# Patient Record
Sex: Female | Born: 1985 | Race: Black or African American | Hispanic: No | Marital: Single | Smoking: Never smoker
Health system: Southern US, Community
[De-identification: ages and names within clinical notes are randomized; demographics above are authoritative.]

## PROBLEM LIST (undated history)

## (undated) DIAGNOSIS — D179 Benign lipomatous neoplasm, unspecified: Secondary | ICD-10-CM

## (undated) DIAGNOSIS — O24419 Gestational diabetes mellitus in pregnancy, unspecified control: Secondary | ICD-10-CM

## (undated) HISTORY — DX: Benign lipomatous neoplasm, unspecified: D17.9

## (undated) HISTORY — PX: NO PAST SURGERIES: SHX2092

---

## 2017-09-27 ENCOUNTER — Other Ambulatory Visit: Payer: Self-pay | Admitting: Internal Medicine

## 2017-09-27 DIAGNOSIS — N63 Unspecified lump in unspecified breast: Secondary | ICD-10-CM

## 2017-10-05 ENCOUNTER — Other Ambulatory Visit: Payer: Self-pay

## 2017-10-16 NOTE — L&D Delivery Note (Signed)
Patient is 32 y.o. G1P0 [redacted]w[redacted]d admitted for IOL for gHTN   Delivery Note At 10:52 AM a viable female was delivered via Vaginal, Spontaneous (Presentation: cephalic; LOA ).  APGAR: 4, 9; weight  .   Placenta status: spontaneous, intact.  Cord: 3VC   Anesthesia:  epidural Episiotomy: None Lacerations: 2nd degree;Perineal Suture Repair: 3.0 vicryl rapide Est. Blood Loss (mL):  400 mL  Mom to postpartum.  Baby to Couplet care / Skin to Skin.  Upon arrival patient was complete and pushing. She pushed with good maternal effort to deliver a healthy baby boy. Baby delivered without difficulty, was noted to have good tone and place on maternal abdomen for oral suctioning, drying and stimulation. Delayed cord clamping performed. Placenta delivered intact with 3V cord. Vaginal canal and perineum was inspected and found to have a left sulcus laceration and a second degree perineal laceration.  Lacerations were repaired with 3-0 vicryl rapide and hemostatic. Pitocin was started and uterus massaged until bleeding slowed. Counts of sharps, instruments, and lap pads were all correct.   Matilde Haymaker, MD PGY-1 9/13/201911:57 AM

## 2017-10-25 ENCOUNTER — Ambulatory Visit
Admission: RE | Admit: 2017-10-25 | Discharge: 2017-10-25 | Disposition: A | Discharge status: Home / Self Care | Payer: 59 | Source: Ambulatory Visit | Attending: Internal Medicine | Admitting: Internal Medicine

## 2017-10-25 ENCOUNTER — Other Ambulatory Visit: Payer: Self-pay | Admitting: Internal Medicine

## 2017-10-25 DIAGNOSIS — N63 Unspecified lump in unspecified breast: Secondary | ICD-10-CM

## 2017-12-21 ENCOUNTER — Ambulatory Visit (INDEPENDENT_AMBULATORY_CARE_PROVIDER_SITE_OTHER): Payer: 59 | Admitting: Family

## 2017-12-21 ENCOUNTER — Encounter: Payer: Self-pay | Admitting: Family

## 2017-12-21 VITALS — BP 133/83 | HR 115 | Temp 98.6°F | Ht 66.0 in | Wt 198.0 lb

## 2017-12-21 DIAGNOSIS — Z3480 Encounter for supervision of other normal pregnancy, unspecified trimester: Secondary | ICD-10-CM

## 2017-12-21 DIAGNOSIS — Z3200 Encounter for pregnancy test, result unknown: Secondary | ICD-10-CM

## 2017-12-21 DIAGNOSIS — Z124 Encounter for screening for malignant neoplasm of cervix: Secondary | ICD-10-CM | POA: Diagnosis not present

## 2017-12-21 DIAGNOSIS — Z1151 Encounter for screening for human papillomavirus (HPV): Secondary | ICD-10-CM

## 2017-12-21 DIAGNOSIS — O0990 Supervision of high risk pregnancy, unspecified, unspecified trimester: Secondary | ICD-10-CM | POA: Insufficient documentation

## 2017-12-21 DIAGNOSIS — Z3201 Encounter for pregnancy test, result positive: Secondary | ICD-10-CM

## 2017-12-21 DIAGNOSIS — D1721 Benign lipomatous neoplasm of skin and subcutaneous tissue of right arm: Secondary | ICD-10-CM

## 2017-12-21 DIAGNOSIS — Z113 Encounter for screening for infections with a predominantly sexual mode of transmission: Secondary | ICD-10-CM | POA: Diagnosis not present

## 2017-12-21 DIAGNOSIS — D573 Sickle-cell trait: Secondary | ICD-10-CM

## 2017-12-21 DIAGNOSIS — O99011 Anemia complicating pregnancy, first trimester: Secondary | ICD-10-CM

## 2017-12-21 DIAGNOSIS — Z3400 Encounter for supervision of normal first pregnancy, unspecified trimester: Secondary | ICD-10-CM

## 2017-12-21 LAB — POCT URINE PREGNANCY: Preg Test, Ur: POSITIVE — AB

## 2017-12-21 NOTE — Progress Notes (Signed)
Subjective:    Alyssa Davidson is a G1P0 at 12.2 days IUP by LMP confirmed by 7 wk [place] at [facility] (need records) being seen today for her first obstetrical visit.  Alyssa Davidson is her alone.  Moved to [place] in 2015 for school.  Currently works as a Scientist, forensic.  FOB does not live local, but is supportive of pregnancy.   Lives with a roommate at this time.  Her obstetrical history is significant for early prenatal care. Patient does intend to breast feed. Pregnancy history fully reviewed.  Patient reports fatigue, nausea, no bleeding and no cramping.  Unable to exercise like before due to fatigue.  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 Tested for thyroid condition, results were normal.  Plan is to excise after pregnancy completed.    Vitals:   12/21/17 1425 12/21/17 1428  BP: 133/83   Pulse: (!) 115   Temp: 98.6 F (37 C)   Weight: 198 lb (89.8 kg)   Height:  5\' 6"  (1.676 m)    HISTORY: OB History  Gravida Para Term Preterm AB Living  1            SAB TAB Ectopic Multiple Live Births               # Outcome Date GA Lbr Len/2nd Weight Sex Delivery Anes PTL Lv  1 Current              Past Medical History:  Diagnosis Date  . Lipoma    right axilla   History reviewed. No pertinent surgical history. Family History  Problem Relation Age of Onset  . Healthy Mother   . Hypertension Father   . Diabetes Father   . Healthy Sister   . Healthy Brother      Exam    BP 133/83   Pulse (!) 115   Temp 98.6 F (37 C)   Ht 5\' 6"  (1.676 m)   Wt 198 lb (89.8 kg)   LMP 09/29/2017 (Approximate)   BMI 31.96 kg/m  Uterine Size: Slightly difficult to discern due to abdominal adipose  Pelvic Exam:    Perineum: No Hemorrhoids, Normal Perineum   Vulva: normal   Vagina:  normal mucosa, normal discharge, no palpable nodules   pH: Not done   Cervix: no bleeding following Pap, no cervical motion tenderness and no lesions   Adnexa:  normal adnexa and no mass, fullness, tenderness   Bony Pelvis: Adequate  System: Breast:  No nipple retraction or dimpling, No nipple discharge or bleeding, unable to palpate mass in right axilla   Skin: normal coloration and turgor, no rashes    Neurologic: negative   Extremities: normal strength, tone, and muscle mass   HEENT neck supple with midline trachea and thyroid without masses   Mouth/Teeth mucous membranes moist, pharynx normal without lesions   Neck supple and no masses   Cardiovascular: regular rate and rhythm, no murmurs or gallops   Respiratory:  appears well, vitals normal, no respiratory distress, acyanotic, normal RR, neck free of mass or lymphadenopathy, chest clear, no wheezing, crepitations, rhonchi, normal symmetric air entry   Abdomen: soft, non-tender; bowel sounds normal; no masses,  no organomegaly   Urinary: urethral meatus normal        Assessment:    Pregnancy: G1P0 Patient Active Problem List   Diagnosis Date Noted  . Supervision of other normal pregnancy, antepartum 12/21/2017        Plan:  Initial labs drawn.  Pap smear collected.   Prenatal vitamins. Problem list reviewed and updated. Genetic Screening discussed:  Panorama requested.  Ultrasound discussed; fetal survey: will be ordered at 18-19 weeks.  Follow up in 4 weeks. 75% of 25 min visit spent on counseling and coordination of care.     Venia Carbon Sh[facility]on [employer] 12/22/2017

## 2017-12-21 NOTE — Patient Instructions (Addendum)
Third Trimester of Pregnancy The third trimester is from week 28 through week 40 (months 7 through 9). The third trimester is a time when the unborn baby (fetus) is growing rapidly. At the end of the ninth month, the fetus is about 20 inches in length and weighs 6-10 pounds. Body changes during your third trimester Your body will continue to go through many changes during pregnancy. The changes vary from woman to woman. During the third trimester:  Your weight will continue to increase. You can expect to gain 25-35 pounds (11-16 kg) by the end of the pregnancy.  You may begin to get stretch marks on your hips, abdomen, and breasts.  You may urinate more often because the fetus is moving lower into your pelvis and pressing on your bladder.  You may develop or continue to have heartburn. This is caused by increased hormones that slow down muscles in the digestive tract.  You may develop or continue to have constipation because increased hormones slow digestion and cause the muscles that push waste through your intestines to relax.  You may develop hemorrhoids. These are swollen veins (varicose veins) in the rectum that can itch or be painful.  You may develop swollen, bulging veins (varicose veins) in your legs.  You may have increased body aches in the pelvis, back, or thighs. This is due to weight gain and increased hormones that are relaxing your joints.  You may have changes in your hair. These can include thickening of your hair, rapid growth, and changes in texture. Some women also have hair loss during or after pregnancy, or hair that feels dry or thin. Your hair will most likely return to normal after your baby is born.  Your breasts will continue to grow and they will continue to become tender. A yellow fluid (colostrum) may leak from your breasts. This is the first milk you are producing for your baby.  Your belly button may stick out.  You may notice more swelling in your hands,  face, or ankles.  You may have increased tingling or numbness in your hands, arms, and legs. The skin on your belly may also feel numb.  You may feel short of breath because of your expanding uterus.  You may have more problems sleeping. This can be caused by the size of your belly, increased need to urinate, and an increase in your body's metabolism.  You may notice the fetus "dropping," or moving lower in your abdomen (lightening).  You may have increased vaginal discharge.  You may notice your joints feel loose and you may have pain around your pelvic bone.  What to expect at prenatal visits You will have prenatal exams every 2 weeks until week 36. Then you will have weekly prenatal exams. During a routine prenatal visit:  You will be weighed to make sure you and the baby are growing normally.  Your blood pressure will be taken.  Your abdomen will be measured to track your baby's growth.  The fetal heartbeat will be listened to.  Any test results from the previous visit will be discussed.  You may have a cervical check near your due date to see if your cervix has softened or thinned (effaced).  You will be tested for Group B streptococcus. This happens between 35 and 37 weeks.  Your health care provider may ask you:  What your birth plan is.  How you are feeling.  If you are feeling the baby move.  If you have had   any abnormal symptoms, such as leaking fluid, bleeding, severe headaches, or abdominal cramping.  If you are using any tobacco products, including cigarettes, chewing tobacco, and electronic cigarettes.  If you have any questions.  Other tests or screenings that may be performed during your third trimester include:  Blood tests that check for low iron levels (anemia).  Fetal testing to check the health, activity level, and growth of the fetus. Testing is done if you have certain medical conditions or if there are problems during the  pregnancy.  Nonstress test (NST). This test checks the health of your baby to make sure there are no signs of problems, such as the baby not getting enough oxygen. During this test, a belt is placed around your belly. The baby is made to move, and its heart rate is monitored during movement.  What is false labor? False labor is a condition in which you feel small, irregular tightenings of the muscles in the womb (contractions) that usually go away with rest, changing position, or drinking water. These are called Braxton Hicks contractions. Contractions may last for hours, days, or even weeks before true labor sets in. If contractions come at regular intervals, become more frequent, increase in intensity, or become painful, you should see your health care provider. What are the signs of labor?  Abdominal cramps.  Regular contractions that start at 10 minutes apart and become stronger and more frequent with time.  Contractions that start on the top of the uterus and spread down to the lower abdomen and back.  Increased pelvic pressure and dull back pain.  A watery or bloody mucus discharge that comes from the vagina.  Leaking of amniotic fluid. This is also known as your "water breaking." It could be a slow trickle or a gush. Let your health care provider know if it has a color or strange odor. If you have any of these signs, call your health care provider right away, even if it is before your due date. Follow these instructions at home: Medicines  Follow your health care provider's instructions regarding medicine use. Specific medicines may be either safe or unsafe to take during pregnancy.  Take a prenatal vitamin that contains at least 600 micrograms (mcg) of folic acid.  If you develop constipation, try taking a stool softener if your health care provider approves. Eating and drinking  Eat a balanced diet that includes fresh fruits and vegetables, whole grains, good sources of protein  such as meat, eggs, or tofu, and low-fat dairy. Your health care provider will help you determine the amount of weight gain that is right for you.  Avoid raw meat and uncooked cheese. These carry germs that can cause birth defects in the baby.  If you have low calcium intake from food, talk to your health care provider about whether you should take a daily calcium supplement.  Eat four or five small meals rather than three large meals a day.  Limit foods that are high in fat and processed sugars, such as fried and sweet foods.  To prevent constipation: ? Drink enough fluid to keep your urine clear or pale yellow. ? Eat foods that are high in fiber, such as fresh fruits and vegetables, whole grains, and beans. Activity  Exercise only as directed by your health care provider. Most women can continue their usual exercise routine during pregnancy. Try to exercise for 30 minutes at least 5 days a week. Stop exercising if you experience uterine contractions.  Avoid heavy   lifting.  Do not exercise in extreme heat or humidity, or at high altitudes.  Wear low-heel, comfortable shoes.  Practice good posture.  You may continue to have sex unless your health care provider tells you otherwise. Relieving pain and discomfort  Take frequent breaks and rest with your legs elevated if you have leg cramps or low back pain.  Take warm sitz baths to soothe any pain or discomfort caused by hemorrhoids. Use hemorrhoid cream if your health care provider approves.  Wear a good support bra to prevent discomfort from breast tenderness.  If you develop varicose veins: ? Wear support pantyhose or compression stockings as told by your healthcare provider. ? Elevate your feet for 15 minutes, 3-4 times a day. Prenatal care  Write down your questions. Take them to your prenatal visits.  Keep all your prenatal visits as told by your health care provider. This is important. Safety  Wear your seat belt at  all times when driving.  Make a list of emergency phone numbers, including numbers for family, friends, the hospital, and police and fire departments. General instructions  Avoid cat litter boxes and soil used by cats. These carry germs that can cause birth defects in the baby. If you have a cat, ask someone to clean the litter box for you.  Do not travel far distances unless it is absolutely necessary and only with the approval of your health care provider.  Do not use hot tubs, steam rooms, or saunas.  Do not drink alcohol.  Do not use any products that contain nicotine or tobacco, such as cigarettes and e-cigarettes. If you need help quitting, ask your health care provider.  Do not use any medicinal herbs or unprescribed drugs. These chemicals affect the formation and growth of the baby.  Do not douche or use tampons or scented sanitary pads.  Do not cross your legs for long periods of time.  To prepare for the arrival of your baby: ? Take prenatal classes to understand, practice, and ask questions about labor and delivery. ? Make a trial run to the hospital. ? Visit the hospital and tour the maternity area. ? Arrange for maternity or paternity leave through employers. ? Arrange for family and friends to take care of pets while you are in the hospital. ? Purchase a rear-facing car seat and make sure you know how to install it in your car. ? Pack your hospital bag. ? Prepare the baby's nursery. Make sure to remove all pillows and stuffed animals from the baby's crib to prevent suffocation.  Visit your dentist if you have not gone during your pregnancy. Use a soft toothbrush to brush your teeth and be gentle when you floss. Contact a health care provider if:  You are unsure if you are in labor or if your water has broken.  You become dizzy.  You have mild pelvic cramps, pelvic pressure, or nagging pain in your abdominal area.  You have lower back pain.  You have persistent  nausea, vomiting, or diarrhea.  You have an unusual or bad smelling vaginal discharge.  You have pain when you urinate. Get help right away if:  Your water breaks before 37 weeks.  You have regular contractions less than 5 minutes apart before 37 weeks.  You have a fever.  You are leaking fluid from your vagina.  You have spotting or bleeding from your vagina.  You have severe abdominal pain or cramping.  You have rapid weight loss or weight gain.    You have shortness of breath with chest pain.  You notice sudden or extreme swelling of your face, hands, ankles, feet, or legs.  Your baby makes fewer than 10 movements in 2 hours.  You have severe headaches that do not go away when you take medicine.  You have vision changes. Summary  The third trimester is from week 28 through week 40, months 7 through 9. The third trimester is a time when the unborn baby (fetus) is growing rapidly.  During the third trimester, your discomfort may increase as you and your baby continue to gain weight. You may have abdominal, leg, and back pain, sleeping problems, and an increased need to urinate.  During the third trimester your breasts will keep growing and they will continue to become tender. A yellow fluid (colostrum) may leak from your breasts. This is the first milk you are producing for your baby.  False labor is a condition in which you feel small, irregular tightenings of the muscles in the womb (contractions) that eventually go away. These are called Braxton Hicks contractions. Contractions may last for hours, days, or even weeks before true labor sets in.  Signs of labor can include: abdominal cramps; regular contractions that start at 10 minutes apart and become stronger and more frequent with time; watery or bloody mucus discharge that comes from the vagina; increased pelvic pressure and dull back pain; and leaking of amniotic fluid. This information is not intended to replace advice  given to you by your health care provider. Make sure you discuss any questions you have with your health care provider. Document Released: 09/26/2001 Document Revised: 03/09/2016 Document Reviewed: 12/03/2012 Elsevier Interactive Patient Education  2017 [employer]  

## 2017-12-22 DIAGNOSIS — D179 Benign lipomatous neoplasm, unspecified: Secondary | ICD-10-CM | POA: Insufficient documentation

## 2017-12-25 LAB — CULTURE, OB URINE

## 2017-12-25 LAB — URINE CULTURE, OB REFLEX

## 2017-12-26 LAB — PRENATAL PROFILE I(LABCORP)
ANTIBODY SCREEN: NEGATIVE
BASOS: 0 %
Basophils Absolute: 0 10*3/uL (ref 0.0–0.2)
EOS (ABSOLUTE): 0.2 10*3/uL (ref 0.0–0.4)
Eos: 2 %
HEMOGLOBIN: 13.7 g/dL (ref 11.1–15.9)
Hematocrit: 40.3 % (ref 34.0–46.6)
Hepatitis B Surface Ag: NEGATIVE
Immature Grans (Abs): 0 10*3/uL (ref 0.0–0.1)
Immature Granulocytes: 1 %
LYMPHS ABS: 2 10*3/uL (ref 0.7–3.1)
Lymphs: 24 %
MCH: 28.5 pg (ref 26.6–33.0)
MCHC: 34 g/dL (ref 31.5–35.7)
MCV: 84 fL (ref 79–97)
MONOS ABS: 1.1 10*3/uL — AB (ref 0.1–0.9)
Monocytes: 13 %
NEUTROS ABS: 5 10*3/uL (ref 1.4–7.0)
Neutrophils: 60 %
Platelets: 242 10*3/uL (ref 150–379)
RBC: 4.81 x10E6/uL (ref 3.77–5.28)
RDW: 14.4 % (ref 12.3–15.4)
RPR Ser Ql: NONREACTIVE
RUBELLA: 15.5 {index} (ref >0.99)
Rh Factor: POSITIVE
WBC: 8.3 10*3/uL (ref 3.4–10.8)

## 2017-12-26 LAB — HEMOGLOBINOPATHY EVALUATION
HEMOGLOBIN A2 QUANTITATION: 4 % — AB (ref 1.8–3.2)
HGB C: 0 %
HGB S: 39.1 % — ABNORMAL HIGH
HGB VARIANT: 0 %
Hemoglobin F Quantitation: 0 % (ref 0.0–2.0)
Hgb A: 56.9 % — ABNORMAL LOW (ref 96.4–98.8)

## 2017-12-27 DIAGNOSIS — D573 Sickle-cell trait: Secondary | ICD-10-CM | POA: Insufficient documentation

## 2017-12-31 LAB — CYTOLOGY - PAP
CHLAMYDIA, DNA PROBE: NEGATIVE
DIAGNOSIS: REACTIVE
Diagnosis: NEGATIVE
HPV: NOT DETECTED
Neisseria Gonorrhea: NEGATIVE

## 2018-01-18 ENCOUNTER — Ambulatory Visit (INDEPENDENT_AMBULATORY_CARE_PROVIDER_SITE_OTHER): Payer: 59 | Admitting: Family

## 2018-01-18 VITALS — BP 125/83 | HR 95 | Temp 98.6°F | Wt 205.8 lb

## 2018-01-18 DIAGNOSIS — Z3400 Encounter for supervision of normal first pregnancy, unspecified trimester: Secondary | ICD-10-CM

## 2018-01-18 NOTE — Progress Notes (Signed)
   PRENATAL VISIT NOTE  Subjective:  Alyssa Davidson is a 32 y.o. G1P0 at [redacted]w[redacted]d being seen today for ongoing prenatal care.  She is currently monitored for the following issues for this low-risk pregnancy and has Supervision of other normal pregnancy, antepartum; Lipoma; and Sickle cell trait ([place]) on their problem list.  Patient reports no complaints.   . Vag. Bleeding: None.   . Denies leaking of fluid.   The following portions of the patient's history were reviewed and updated as appropriate: allergies, current medications, past family history, past medical history, past social history, past surgical history and problem list. Problem list updated.  Objective:   Vitals:   01/18/18 1356  BP: 125/83  Pulse: 95  Temp: 98.6 F (37 C)  Weight: 205 lb 12.8 oz (93.4 kg)    Fetal Status: Fetal Heart Rate (bpm): 164 Fundal Height: 17 cm       General:  Alert, oriented and cooperative. Patient is in no acute distress.  Skin: Skin is warm and dry. No rash noted.   Cardiovascular: Normal heart rate noted  Respiratory: Normal respiratory effort, no problems with respiration noted  Abdomen: Soft, gravid, appropriate for gestational age.  Pain/Pressure: Absent     Pelvic: Cervical exam deferred        Extremities: Normal range of motion.     Mental Status: Normal mood and affect. Normal behavior. Normal judgment and thought content.   Assessment and Plan:  Pregnancy: G1P0 at [redacted]w[redacted]d  1. Supervision of normal first pregnancy, antepartum - [place] MFM OB COMP + 14 WK; Future - HIV antibody - Discussed weight and minimizing carbohydrates (rice/beans)  Preterm labor symptoms and general obstetric precautions including but not limited to vaginal bleeding, contractions, leaking of fluid and fetal movement were reviewed in detail with the patient. Please refer to After Visit Summary for other counseling recommendations.  Return in about 1 month (around 02/15/2018).  No future appointments.  Kathrine Haddock, CNM

## 2018-01-19 LAB — SPECIMEN STATUS

## 2018-01-21 LAB — HIV ANTIBODY (ROUTINE TESTING W REFLEX): HIV Screen 4th Generation wRfx: NONREACTIVE

## 2018-01-31 ENCOUNTER — Ambulatory Visit (HOSPITAL_COMMUNITY): Payer: 59

## 2018-02-05 ENCOUNTER — Ambulatory Visit (HOSPITAL_COMMUNITY)
Admission: RE | Admit: 2018-02-05 | Discharge: 2018-02-05 | Disposition: A | Discharge status: Home / Self Care | Payer: 59 | Source: Ambulatory Visit | Attending: Family | Admitting: Family

## 2018-02-05 ENCOUNTER — Other Ambulatory Visit: Payer: Self-pay | Admitting: Family

## 2018-02-05 DIAGNOSIS — Z3400 Encounter for supervision of normal first pregnancy, unspecified trimester: Secondary | ICD-10-CM

## 2018-02-05 DIAGNOSIS — Z3A18 18 weeks gestation of pregnancy: Secondary | ICD-10-CM | POA: Insufficient documentation

## 2018-02-05 DIAGNOSIS — Z369 Encounter for antenatal screening, unspecified: Secondary | ICD-10-CM | POA: Diagnosis present

## 2018-02-12 ENCOUNTER — Telehealth: Payer: Self-pay | Admitting: Family Medicine

## 2018-02-12 NOTE — Telephone Encounter (Signed)
Reviewed ultrasound results as incomplete but not abnormal. F/u u/s recommended in 6 wks. Will be scheduled at next visit.

## 2018-02-15 ENCOUNTER — Ambulatory Visit (INDEPENDENT_AMBULATORY_CARE_PROVIDER_SITE_OTHER): Payer: 59 | Admitting: Family

## 2018-02-15 VITALS — BP 113/75 | HR 105 | Temp 98.7°F | Wt 213.4 lb

## 2018-02-15 DIAGNOSIS — Z363 Encounter for antenatal screening for malformations: Secondary | ICD-10-CM

## 2018-02-15 DIAGNOSIS — Z3480 Encounter for supervision of other normal pregnancy, unspecified trimester: Secondary | ICD-10-CM

## 2018-02-15 NOTE — Progress Notes (Signed)
   PRENATAL VISIT NOTE  Subjective:  Alyssa Davidson is a 32 y.o. G1P0 at [redacted]w[redacted]d being seen today for ongoing prenatal care.  She is currently monitored for the following issues for this low-risk pregnancy and has Supervision of other normal pregnancy, antepartum and Sickle cell trait ([place]) on their problem list.  Patient reports no complaints.  Pt concerned about the weight gain.  24 hr recall:  B oats, milk,  L sweet potato porridge; D veggies and chicken breast.   Contractions: Not present. Vag. Bleeding: None.  Movement: Present. Denies leaking of fluid.   The following portions of the patient's history were reviewed and updated as appropriate: allergies, current medications, past family history, past medical history, past social history, past surgical history and problem list. Problem list updated.  Objective:   Vitals:   02/15/18 1643  BP: 113/75  Pulse: (!) 105  Temp: 98.7 F (37.1 C)  Weight: 213 lb 6.4 oz (96.8 kg)    Fetal Status: Fetal Heart Rate (bpm): 162 Fundal Height: 22 cm Movement: Present     General:  Alert, oriented and cooperative. Patient is in no acute distress.  Skin: Skin is warm and dry. No rash noted.   Cardiovascular: Normal heart rate noted  Respiratory: Normal respiratory effort, no problems with respiration noted  Abdomen: Soft, gravid, appropriate for gestational age.  Pain/Pressure: Absent     Pelvic: Cervical exam deferred        Extremities: Normal range of motion.  Edema: None  Mental Status: Normal mood and affect. Normal behavior. Normal judgment and thought content.   Assessment and Plan:  Pregnancy: G1P0 at [redacted]w[redacted]d  1. Supervision of other normal pregnancy, antepartum - Reviewed anatomy ultrasound - Discussed strategy of decreasing carbs in am and increasing walking time  Preterm labor symptoms and general obstetric precautions including but not limited to vaginal bleeding, contractions, leaking of fluid and fetal movement were reviewed in  detail with the patient. Please refer to After Visit Summary for other counseling recommendations.  Return in about 1 month (around 03/15/2018).  No future appointments.  Kathrine Haddock, CNM

## 2018-02-15 NOTE — Addendum Note (Signed)
Addended by: Gwen Pounds on: 02/15/2018 05:09 PM   Modules accepted: Orders

## 2018-02-18 ENCOUNTER — Encounter (INDEPENDENT_AMBULATORY_CARE_PROVIDER_SITE_OTHER): Payer: Self-pay

## 2018-03-12 ENCOUNTER — Other Ambulatory Visit: Payer: Self-pay | Admitting: Family

## 2018-03-12 ENCOUNTER — Ambulatory Visit (HOSPITAL_COMMUNITY)
Admission: RE | Admit: 2018-03-12 | Discharge: 2018-03-12 | Disposition: A | Discharge status: Home / Self Care | Payer: 59 | Source: Ambulatory Visit | Attending: Family | Admitting: Family

## 2018-03-12 DIAGNOSIS — Z0489 Encounter for examination and observation for other specified reasons: Secondary | ICD-10-CM

## 2018-03-12 DIAGNOSIS — Z362 Encounter for other antenatal screening follow-up: Secondary | ICD-10-CM

## 2018-03-12 DIAGNOSIS — IMO0002 Reserved for concepts with insufficient information to code with codable children: Secondary | ICD-10-CM

## 2018-03-12 DIAGNOSIS — Z3A23 23 weeks gestation of pregnancy: Secondary | ICD-10-CM

## 2018-03-12 DIAGNOSIS — O321XX0 Maternal care for breech presentation, not applicable or unspecified: Secondary | ICD-10-CM | POA: Diagnosis not present

## 2018-03-12 DIAGNOSIS — Z363 Encounter for antenatal screening for malformations: Secondary | ICD-10-CM | POA: Diagnosis not present

## 2018-03-14 ENCOUNTER — Ambulatory Visit (INDEPENDENT_AMBULATORY_CARE_PROVIDER_SITE_OTHER): Payer: 59 | Admitting: Advanced Practice Midwife

## 2018-03-14 ENCOUNTER — Encounter: Payer: Self-pay | Admitting: Advanced Practice Midwife

## 2018-03-14 VITALS — BP 115/79 | HR 109 | Wt 219.2 lb

## 2018-03-14 DIAGNOSIS — Z3480 Encounter for supervision of other normal pregnancy, unspecified trimester: Secondary | ICD-10-CM

## 2018-03-14 DIAGNOSIS — D573 Sickle-cell trait: Secondary | ICD-10-CM

## 2018-03-14 MED ORDER — FAMOTIDINE 40 MG PO TABS: 40.0000 mg | ORAL_TABLET | Freq: Every day | ORAL | 6 refills | Status: DC

## 2018-03-14 MED ORDER — FAMOTIDINE 40 MG PO TABS
40.0000 mg | ORAL_TABLET | Freq: Every day | ORAL | 6 refills | Status: DC
Start: 2018-03-14 — End: 2018-03-14

## 2018-03-14 NOTE — Patient Instructions (Addendum)
Gestational diabetes mellitus (GDM) is high blood glucose (high blood sugar) that develops during pregnancy (ADA, 2018). With routine prenatal care provided in the [place] ([place] most people drink "Glucola" as part of a screening test before diagnosing gestational diabetes. In other parts of the world, care providers may give mothers a screening test and/or a diagnostic test for GDM using other types of glucose drinks. Diagnosing gestational diabetes is a complex topic with lots of controversy. Even though we have a lot of research on diagnosing gestational diabetes, professionals around the world still disagree on the best way to screen for and diagnose this condition. This article will describe gestational diabetes, explain the reasons for the disagreement over diagnosing gestational diabetes, and discuss the potential risks linked to the condition, as well as the potential benefits from treatment. What is gestational diabetes? To understand gestational diabetes, it's helpful to first learn how the body metabolizes sugar. After you eat or drink carbohydrates (often called "carbs"), your gastrointestinal system helps the carbohydrates enter your bloodstream as glucose (often called "sugar"), which your body must turn into energy. Insulin is a hormone produced by the pancreas that helps deliver glucose from the blood into your body's cells, where the glucose can be turned into energy that fuels your body's functions. Insulin also helps convert extra glucose into fat for storage. All pregnant people experience some metabolic changes during pregnancy. In normal pregnancy, hormones from the placenta make it harder for your body to use insulin-you may require up to three times as much insulin to overcome the increased insulin resistance (ADA, 2016). Insulin resistance means that your cells are resistant to insulin-it's kind of like if a neighbor (i.e. insulin) keeps knocking on your door (i.e. cell) with  gifts of food, and over time, has to knock louder and louder to get you to open the door! In a pregnancy that is not complicated by gestational diabetes, it's harder for insulin to 'open the door,' but the body's insulin response, or ability to produce more insulin, is enough to overcome the resistance. However, with gestational diabetes, there is too much insulin resistance, too little insulin response (called low beta cell function), or a combination of both (Powe et al. 2016). Some women with GDM have more of a problem with insulin resistance, while others with GDM have more of a problem with low beta cell function (Personal correspondence, Dr. Barbour, 2018). Going back to our analogy, low beta cell function is like if the neighbor who is knocking gets tired over time and knocks more softly. So, with GDM, the door doesn't open because of your high reluctance to answer it (insulin resistance), the neighbor's low intensity in knocking (low beta cell function), or a combination of both factors. You can imagine that with either scenario, the neighbor gives up and takes the food somewhere else. In a similar way, when this happens with GDM, glucose builds up in the blood until it reaches abnormally high levels, called hyperglycemia. The routine tests that are done in pregnancy to identify GDM do not directly measure insulin resistance or beta cell function. Instead, the tests measure blood sugar levels, because it is high blood sugar that can cause problems for mother and baby. If you have GDM, treatment with diet, exercise, and sometimes medicine, is necessary to maintain healthy blood sugar levels. Researchers think insulin resistance exists to help move more nutrients to the baby (instead of the mother) to promote healthy fetal growth and development (Farrar et al. 2017a). The   mother's body is making sure that the baby gets enough nutrition from sugar in the blood, even if food becomes scarce for the mother.  This adaptation helped us in the past, but most people today have too much food available-including too many processed foods with simple, easily digested sugars. This situation has led to more people putting on extra body weight, which tends to increase insulin resistance and decrease beta cell function, which further increases the risk of high blood sugar. The one-part diagnostic method  Outside of the [place] most countries promote some variation of the one-part diagnostic method (universal or selective), although in [place] they have endorsed a different version of the two-part screening and diagnostic method (Table 5). According to IADPSG criteria, the one-part diagnostic test is a 75-gram, 2-hour OGTT, which requires fasting before the test. This OGTT measures blood sugar after fasting and again at one and two hours after the test. Gestational diabetes is diagnosed with one or more high blood sugar values. Method: . Drink a 75-gram diagnostic OGTT, with blood sugar measured after fasting (?8 hours) and at 1 and 2-hours after the test.  . The diagnosis of GDM is made when any of the following blood sugar values are met or exceeded:  . Fasting: 92 mg/dL  . 1-hour: 180 mg/dL  . 2-hour: 153 mg/dL Unlike the two-part screening and diagnostic method, the one-part diagnostic method test cutoffs were developed based specifically on pregnancy and birth outcomes instead of the mother's future risk of diabetes (IADPSG, 2010; ADA, 2018). As we already mentioned, adopting the IADPSG criteria would greatly increase the rate of people diagnosed with GDM (NIH, 2013). Using the one-part diagnostic method, more people would potentially benefit from treatment for high blood sugar. However, there are also downsides (which is why there is no agreement on which method is best). Using the 75-gram test with IADPSG criteria, everyone has to fast before the test, which may be difficult for some people. Also, an increase in the  number of people diagnosed with GDM comes with an increase in personal and health care costs. Mothers diagnosed with GDM face more medical appointments (to meet with a registered dietitian, a diabetes educator, or both) and they are told to carefully watch what they eat and monitor blood sugar levels several times a day (NIH, 2013). Testing supplies, blood sugar medication (if needed), and extra monitoring all come with significant costs, which are not always fully covered by insurance in the [place] A diagnosis of GDM can be stressful for some mothers, and care providers may pressure women to schedule an induction simply because they have been diagnosed as having GDM. The bottom line . We have strong evidence that treating GDM improves birth outcomes for mothers and babies. . Gestational diabetes begins during pregnancy, but some people enter pregnancy with pre-existing diabetes (type 2 diabetes) that was previously undiagnosed. To detect pre-existing diabetes, care providers may offer screening in early pregnancy to mothers with risk factors for type 2 diabetes. . There is widespread agreement that screening or testing for GDM should take place between 24 and 28 weeks of pregnancy. However, researchers and organizations disagree about the best way to screen and diagnose GDM: . Some countries and professional organizations (such as ACOG in the [place] prefer a two-part method that includes a screening test (frequently the "Glucola" drink), and if that is positive, women take a diagnostic test (which involves fasting, drinking a glucose beverage, and having multiple blood tests). . However, most other countries   and organizations prefer a one-part method where everybody (or at least everybody with risk factors for GDM) receives the one-part diagnostic test. . With the two-part screening and diagnostic method used in the [place] cutoffs for GDM diagnosis vary by hospital. When you get your results, it may be helpful  to obtain the actual numbers, rather than a statement that you "passed" or "failed" the glucose test. Compare your test results with the Carpenter-Coustan or National Diabetes Data Group Criteria to get a better feel for where your results fall. . Although many people contact us about alternatives to drinking the standard glucose solution, the evidence on alternatives is very limited at this time: . We don't know if getting a sugar load from candy, juice, or food screens for GDM as well as the standard glucose drink. Marland Kitchen Researchers have suggested that a fasting plasma glucose test in the third trimester may be useful as a screening test when it is used with upper and lower cutoffs to 'rule-in' or 'rule-out' GDM. However, more research is needed. Marland Kitchen People who would rather not drink the standard glucose beverage, or who can't due to vomiting or other reasons, could discuss alternative methods with their provider. However, we do not have sufficient evidence on alternatives at this time to state which alternative is best, or how accurate these alternatives may be. Receiving a diagnosis of GDM can be stressful for many people. However, the benefits of a positive test result are that you can uncover the potential for health problems before they become a real problem, and take action to improve your health and birth outcomes.  AREA PEDIATRIC/FAMILY [place] [address] [place], [place]  [address] Phone - [phone]   Fax - [phone]  [facility] [address] [place], [place] [address] Phone - [phone]   Fax - [place] [address], [place]  [address] Phone - [phone]   Fax - [phone]  [place] [address] [place] [address] [place], [place]  [address] Phone - [phone]   Fax - [phone]  [place] [address] [place], [place]   [address] Phone - [phone]   Fax - [phone]  [facility] [address] [place], [place]  [address] Phone - [phone]   Fax - [place] [address], [place], [place]  [address] Phone - [phone]   Fax - [phone]  [place] [address] [place], [place] [address] [place], [place]  [address] Phone - [phone]   Fax - [place] [address] [place], [place]  [address] Phone - [phone]   Fax - [phone] [facility] [place]. [address] [place], [place]  [address] Phone - [phone]   Fax - [phone]  [place] [address] [place], [place]  [address] Phone - [phone]   Fax - [phone]  [facility] [place], [place], [place]  [address] Phone - [phone]   Fax - [place] [address], [place], [place]  [address] Phone - [phone]   Fax - [phone]  [facility] [address] [place], [place]  [address] Phone - [place] [address] [place], [place]  [address] Phone -  [phone]   Fax - [place] [address] [place], [place]  [address] Phone - [phone]   Fax - [phone]  [place] [address] [place], [place]  [address] Phone - [phone]   Fax - [phone]  [place] [place], [place]  [address] Phone - [phone]   Fax - [place], [place], [place]  [address] Phone - [phone]   Fax - [place] [address], [place], [place]  [address] Phone - [phone]   Fax - [phone]  [place] [address] [place], [place]  [address] Phone - [phone]   Fax - [place] [address] [place], [place]  [address] Phone - [phone]   Fax - [phone]  [place] [address] [place], [place]  [address] Phone - [phone]   Fax - [phone] [place] [address] [place], [place]  [address] Phone - [phone]   Fax - [place] [address] [place], [place]  [address] Phone - [phone]   Fax - [phone]  [place] [address], [place], [place]  [address] Phone - [phone]   Fax - [phone]  [place] [address] [place] [address] Phone [phone]  Fax [phone] Childbirth Education Options: [facility] Department Classes:  Childbirth education classes can help you get ready for a positive parenting experience. You can also meet other expectant parents and get free stuff for your baby. Each class runs for five weeks on the same night and costs $45 for the mother-to-be and her support person. Medicaid covers the cost if you are eligible. Call [phone] to register. [facility] Childbirth Education:  [phone] or [phone] or [email or web address]  Baby & Me Class: Discuss newborn & infant parenting and family adjustment issues with other new mothers in a relaxed environment. Each week brings a new speaker or baby-centered activity. We encourage new mothers to join [place] every Thursday at 11:00am. Babies birth until crawling. No registration or fee. Daddy [employer]: This course offers Dads-to-be the tools and knowledge needed to feel confident on their journey to becoming new fathers. Experienced dads, who have been trained as coaches, teach dads-to-be how to hold, comfort, diaper, swaddle and play with  their infant while being able to support the new mom as well. A class for men taught by men. $25/dad Big Brother/Big Sister: Let your children share in the joy of a new brother or sister in this special class designed just for them. Class includes discussion about how families care for babies: swaddling, holding, diapering, safety as well as how they can be helpful in their new role. This class is designed for children ages 41 to 37, but any age is welcome. Please register each child individually. $5/child  Mom Talk: This mom-led group offers support and connection to mothers as they journey through the adjustments and struggles of that sometimes overwhelming first year after the birth of a child. Tuesdays at 10:00am and Thursdays at 6:00pm. Babies welcome. No registration or fee. Breastfeeding Support Group: This group is a mother-to-mother support circle where moms have the opportunity to share their breastfeeding experiences. A Lactation Consultant is present for questions and concerns. Meets each Tuesday at  11:00am. No fee or registration. Breastfeeding Your Baby: Learn what to expect in the first days of breastfeeding your newborn.  This class will help you feel more confident with the skills needed to begin your breastfeeding experience. Many new mothers are concerned about breastfeeding after leaving the hospital. This class will also address the most common fears and challenges about breastfeeding during the first few weeks, months and beyond. (call for fee) Comfort Techniques and Tour: This 2 hour interactive class will provide you the opportunity to learn & practice hands-on techniques that can help relieve some of the discomfort of labor and encourage your baby to rotate toward the best position for birth. You and your partner will be able to try a variety of labor positions with birth balls and rebozos as well as practice breathing, relaxation, and visualization techniques. A tour of the [facility] is included with this class. $20 per registrant and support person Childbirth Class- Weekend Option: This class is a Weekend version of our Birth & Baby series. It is designed for parents who have a difficult time fitting several weeks of classes into their schedule. It covers the care of your newborn and the basics of labor and childbirth. It also includes a [place] [facility] and lunch. The class is held two consecutive days: beginning on Friday evening from 6:30 - 8:30 p.m. and the next day, Saturday from 9 a.m. - 4 p.m. (call for fee) Doren Custard Class: Interested in a waterbirth?  This informational class will help you discover whether waterbirth is the right fit for you. Education about waterbirth itself, supplies you would need and how to assemble your support team is what you can expect from this class. Some obstetrical practices require this class in order to pursue a waterbirth. (Not all obstetrical practices offer waterbirth-check with your healthcare provider.) Register only the expectant mom, but you are encouraged to bring your partner to class! Required if planning waterbirth, no fee. Infant/Child CPR: Parents, grandparents, babysitters, and friends learn Cardio-Pulmonary Resuscitation skills for infants and children. You will also learn how to treat both conscious and unconscious choking in infants and children. This Family & Friends program does not offer certification. Register each participant individually to ensure that enough mannequins are available. (Call for fee) Grandparent Love: Expecting a grandbaby? This class is for you! Learn about the latest infant care and safety recommendations and ways to support your own child as he or she transitions into the parenting role. Taught by Registered Nurses who are childbirth instructors, but most importantly...they are grandmothers too! $10/person. Childbirth Class- Natural Childbirth:  This series of 5 weekly classes is for expectant parents who want to learn and practice natural methods of coping with the process of labor and childbirth. Relaxation, breathing, massage, visualization, role of the partner, and helpful positioning are highlighted. Participants learn how to be confident in their body's ability to give birth. This class will empower and help parents make informed decisions about their own care. Includes discussion that will help new parents transition into the immediate postpartum period. [facility] is included. We suggest taking this class between 25-32 weeks, but it's only a recommendation. $75 per registrant and one support person or $30 Medicaid. Childbirth Class- 3 week Series: This option of 3 weekly classes helps you and your labor partner prepare for childbirth. Newborn care, labor & birth, cesarean birth, pain management, and comfort techniques are discussed and a  [facility] is included. The class meets at the same time, on the same day of the week for 3 consecutive weeks beginning with the starting date you choose. $60 for registrant and one support person.  Marvelous Multiples: Expecting twins, triplets, or more? This class covers the differences in labor, birth, parenting, and breastfeeding issues that face multiples' parents. NICU tour is included. Led by a Certified Childbirth Educator who is the mother of twins. No fee. Caring for Baby: This class is for expectant and adoptive parents who want to learn and practice the most up-to-date newborn care for their babies. Focus is on birth through the first six weeks of life. Topics include feeding, bathing, diapering, crying, umbilical cord care, circumcision care and safe sleep. Parents learn to recognize symptoms of illness and when to call the pediatrician. Register only the mom-to-be and your partner or support person can plan to come with you! $10  per registrant and support person Childbirth Class- online option: This online class offers you the freedom to complete a Birth and Baby series in the comfort of your own home. The flexibility of this option allows you to review sections at your own pace, at times convenient to you and your support people. It includes additional video information, animations, quizzes, and extended activities. Get organized with helpful eClass tools, checklists, and trackers. Once you register online for the class, you will receive an email within a few days to accept the invitation and begin the class when the time is right for you. The content will be available to you for 60 days. $60 for 60 days of online access for you and your support people.  Local Doulas: Natural Baby Doulas [email or web address] Tel: [phone] [email or web address] [employer] [phone] [email or web address] The Labor Hassell Halim  (also do waterbirth tub rental) [phone] [email or web address] Triad Birth Doula [phone] [email or web address] NotebookDistributors.fi Sacred Rhythms  [phone] [email or web address]/ [employer] (PADA) [email or web address] [place] Birth and Baby  [email or web address] Considering Waterbirth? Guide for patients at [facility] [employer]  Why consider waterbirth?  . Gentle birth for babies . Less pain medicine used in labor . May allow for passive descent/less pushing . May reduce perineal tears  . More mobility and instinctive maternal position changes . Increased maternal relaxation . Reduced blood pressure in labor  Is waterbirth safe? What are the risks of infection, drowning or other complications?  . Infection: o Very low risk (3.7 % for tub vs 4.8% for bed) o 7 in 8000 waterbirths with  documented infection o Poorly cleaned equipment most common cause o Slightly lower group B strep transmission rate  . Drowning o Maternal:  - Very low risk   - Related to seizures or fainting o Newborn:  - Very low risk. No evidence of increased risk of respiratory problems in multiple large studies - Physiological protection from breathing under water - Avoid underwater birth if there are any fetal complications - Once baby's head is out of the water, keep it out.  . Birth complication o Some reports of cord trauma, but risk decreased by bringing baby to surface gradually o No evidence of increased risk of shoulder dystocia. Mothers can usually change positions faster in water than in a bed, possibly aiding the maneuvers to free the shoulder.   You must attend a Doren Custard class at [facility]  3rd Wednesday of every month from 7-9pm  [employer] by calling [phone] or online at [email or web address]  Bring [place]  the certificate from the class to your prenatal appointment  Meet with a midwife at 36 weeks to see if you can still plan a waterbirth and to sign the consent.   Purchase or rent the following supplies:   Water Birth Pool (Birth Pool in a Box or [place] for instance)  (Tubs start ~$125)  Single-use disposable tub liner designed for your brand of tub  New garden hose labeled "lead-free", "suitable for drinking water",  Electric drain pump to remove water (We recommend 792 gallon per hour or greater pump.)   Separate garden hose to remove the dirty water  Fish net  Bathing suit top (optional)  Long-handled mirror (optional)  Places to purchase or rent supplies  [email or web address].is for tub purchases and supplies  Waterbirthsolutions.com for tub purchases and supplies  The Labor Ladies ([email or web address]) $275 for tub rental/set-up & take down/kit   [employer] (http://www.[email or web address].[email or web address]) Information regarding  doulas (labor support) who provide pool rentals  Our practice has a Birth Pool in a Box tub at the hospital that you may borrow on a first-come-first-served basis. It is your responsibility to to set up, clean and break down the tub. We cannot guarantee the availability of this tub in advance. You are responsible for bringing all accessories listed above. If you do not have all necessary supplies you cannot have a waterbirth.    Things that would prevent you from having a waterbirth:  Premature, <37wks  Previous cesarean birth  Presence of thick meconium-stained fluid  Multiple gestation (Twins, triplets, etc.)  Uncontrolled diabetes or gestational diabetes requiring medication  Hypertension requiring medication or diagnosis of pre-eclampsia  Heavy vaginal bleeding  Non-reassuring fetal heart rate  Active infection (MRSA, etc.). Group B Strep is NOT a contraindication for  waterbirth.  If your labor has to be induced and induction method requires continuous  monitoring of the baby's heart rate  Other risks/issues identified by your obstetrical provider  Please remember that birth is unpredictable. Under certain unforeseeable circumstances your provider may advise against giving birth in the tub. These decisions will be made on a case-by-case basis and with the safety of you and your baby as our highest priority.    Places to have your son circumcised:    [facility] [phone] while you are in hospital  [facility] [phone] $244 by 4 wks  [facility] [phone] $175 by 2 wks  [facility] [phone] $250 by 7 days [facility] [phone] $269 by 4 wks  These prices sometimes change but are roughly what you can expect to pay. Please call and confirm pricing.   Circumcision is considered an  elective/non-medically necessary procedure. There are many reasons parents decide to have their sons circumsized. During the first year of life circumcised males have a reduced risk of urinary tract infections but after this year the rates between circumcised males and uncircumcised males are the same.  It is safe to have your son circumcised outside of the hospital and the places above perform them regularly.   Deciding about Circumcision in Baby Boys  (Up-to-date The Basics)  What is circumcision?  Circumcision is a surgery that removes the skin that covers the tip of the penis, called the "foreskin" Circumcision is usually done when a boy is between 74 and 70 days old. In the [place], circumcision is common. In some other countries, fewer boys are circumcised. Circumcision is a common tradition in some religions.  Should I have my baby boy circumcised?  There is no  easy answer. Circumcision has some benefits. But it also has risks. After talking with your doctor, you will have to decide for yourself what is right for your family.  What are the benefits of circumcision?  Circumcised boys seem to have slightly lower rates of: ?Urinary tract infections ?Swelling of the opening at the tip of the penis Circumcised men seem to have slightly lower rates of: ?Urinary tract infections ?Swelling of the opening at the tip of the penis ?Penis cancer ?HIV and other infections that you catch during sex ?Cervical cancer in the women they have sex with Even so, in the [place], the risks of these problems are small - even in boys and men who have not been circumcised. Plus, boys and men who are not circumcised can reduce these extra risks by: ?Cleaning their penis well ?Using condoms during sex  What are the risks of circumcision?  Risks include: ?Bleeding or infection from the surgery ?Damage to or amputation of the penis ?A chance that the doctor will cut off too much or not enough  of the foreskin ?A chance that sex won't feel as good later in life Only about 1 out of every 200 circumcisions leads to problems. There is also a chance that your health insurance won't pay for circumcision.  How is circumcision done in baby boys?  First, the baby gets medicine for pain relief. This might be a cream on the skin or a shot into the base of the penis. Next, the doctor cleans the baby's penis well. Then he or she uses special tools to cut off the foreskin. Finally, the doctor wraps a bandage (called gauze) around the baby's penis. If you have your baby circumcised, his doctor or nurse will give you instructions on how to care for him after the surgery. It is important that you follow those instructions carefully.

## 2018-03-14 NOTE — Progress Notes (Signed)
   PRENATAL VISIT NOTE  Subjective:  Alyssa Davidson is a 32 y.o. G1P0 at [redacted]w[redacted]d being seen today for ongoing prenatal care.  She is currently monitored for the following issues for this low-risk pregnancy and has Supervision of other normal pregnancy, antepartum and Sickle cell trait ([place]) on their problem list.  Patient reports no complaints.  Contractions: Not present. Vag. Bleeding: None.  Movement: Present. Denies leaking of fluid.   The following portions of the patient's history were reviewed and updated as appropriate: allergies, current medications, past family history, past medical history, past social history, past surgical history and problem list. Problem list updated.  Objective:   Vitals:   03/14/18 0943  BP: 115/79  Pulse: (!) 109  Weight: 219 lb 3.2 oz (99.4 kg)    Fetal Status:     Movement: Present     General:  Alert, oriented and cooperative. Patient is in no acute distress.  Skin: Skin is warm and dry. No rash noted.   Cardiovascular: Normal heart rate noted  Respiratory: Normal respiratory effort, no problems with respiration noted  Abdomen: Soft, gravid, appropriate for gestational age.  Pain/Pressure: Present     Pelvic: Cervical exam deferred        Extremities: Normal range of motion.  Edema: Trace  Mental Status: Normal mood and affect. Normal behavior. Normal judgment and thought content.   Assessment and Plan:  Pregnancy: G1P0 at [redacted]w[redacted]d  1. Supervision of other normal pregnancy, antepartum  2. Sickle cell trait ([facility]) - Culture, OB Urine  3. Heartburn in pregnancy RX: pepcid 40mg  QD #30 with 6 RF sent to [facility] on [address]   Preterm labor symptoms and general obstetric precautions including but not limited to vaginal bleeding, contractions, leaking of fluid and fetal movement were reviewed in detail with the patient. Please refer to After Visit Summary for other counseling recommendations.  Return in about 1 month (around 04/11/2018).  No future  appointments.  Marcille Buffy, CNM

## 2018-03-15 ENCOUNTER — Encounter: Payer: 59 | Admitting: Family

## 2018-03-16 LAB — URINE CULTURE, OB REFLEX

## 2018-03-16 LAB — CULTURE, OB URINE

## 2018-04-09 ENCOUNTER — Other Ambulatory Visit: Payer: Self-pay | Admitting: *Deleted

## 2018-04-09 DIAGNOSIS — Z3480 Encounter for supervision of other normal pregnancy, unspecified trimester: Secondary | ICD-10-CM

## 2018-04-10 ENCOUNTER — Other Ambulatory Visit: Payer: 59

## 2018-04-10 DIAGNOSIS — Z3480 Encounter for supervision of other normal pregnancy, unspecified trimester: Secondary | ICD-10-CM

## 2018-04-12 ENCOUNTER — Other Ambulatory Visit: Payer: 59

## 2018-04-12 ENCOUNTER — Ambulatory Visit (INDEPENDENT_AMBULATORY_CARE_PROVIDER_SITE_OTHER): Payer: 59 | Admitting: Family

## 2018-04-12 VITALS — BP 132/84 | HR 103 | Wt 224.6 lb

## 2018-04-12 DIAGNOSIS — Z23 Encounter for immunization: Secondary | ICD-10-CM | POA: Diagnosis not present

## 2018-04-12 DIAGNOSIS — Z3403 Encounter for supervision of normal first pregnancy, third trimester: Secondary | ICD-10-CM

## 2018-04-12 DIAGNOSIS — Z3480 Encounter for supervision of other normal pregnancy, unspecified trimester: Secondary | ICD-10-CM

## 2018-04-12 LAB — CBC
HEMATOCRIT: 34.7 % (ref 34.0–46.6)
Hemoglobin: 11.7 g/dL (ref 11.1–15.9)
MCH: 28.5 pg (ref 26.6–33.0)
MCHC: 33.7 g/dL (ref 31.5–35.7)
MCV: 85 fL (ref 79–97)
Platelets: 189 10*3/uL (ref 150–450)
RBC: 4.1 x10E6/uL (ref 3.77–5.28)
RDW: 14.5 % (ref 12.3–15.4)
WBC: 12.8 10*3/uL — AB (ref 3.4–10.8)

## 2018-04-12 LAB — GLUCOSE TOLERANCE, 2 HOURS W/ 1HR
GLUCOSE, 2 HOUR: 187 mg/dL — AB (ref 65–152)
Glucose, 1 hour: 162 mg/dL (ref 65–179)
Glucose, Fasting: 94 mg/dL — ABNORMAL HIGH (ref 65–91)

## 2018-04-12 LAB — RPR: RPR Ser Ql: NONREACTIVE

## 2018-04-12 LAB — HIV ANTIBODY (ROUTINE TESTING W REFLEX): HIV SCREEN 4TH GENERATION: NONREACTIVE

## 2018-04-12 NOTE — Patient Instructions (Addendum)
Place 24-38 weeks prenatal visit patient instructions here.   Tdap Vaccine (Tetanus, Diphtheria and Pertussis): What You Need to Know 1. Why get vaccinated? Tetanus, diphtheria and pertussis are very serious diseases. Tdap vaccine can protect [place] from these diseases. And, Tdap vaccine given to pregnant women can protect newborn babies against pertussis. TETANUS (Lockjaw) is rare in the [place] today. It causes painful muscle tightening and stiffness, usually all over the body.  It can lead to tightening of muscles in the head and neck so you can't open your mouth, swallow, or sometimes even breathe. Tetanus kills about 1 out of 10 people who are infected even after receiving the best medical care.  DIPHTHERIA is also rare in the [place] today. It can cause a thick coating to form in the back of the throat.  It can lead to breathing problems, heart failure, paralysis, and death.  PERTUSSIS (Whooping Cough) causes severe coughing spells, which can cause difficulty breathing, vomiting and disturbed sleep.  It can also lead to weight loss, incontinence, and rib fractures. Up to 2 in 100 adolescents and 5 in 100 adults with pertussis are hospitalized or have complications, which could include pneumonia or death.  These diseases are caused by bacteria. Diphtheria and pertussis are spread from person to person through secretions from coughing or sneezing. Tetanus enters the body through cuts, scratches, or wounds. Before vaccines, as many as 200,000 cases of diphtheria, 200,000 cases of pertussis, and hundreds of cases of tetanus, were reported in the [place] each year. Since vaccination began, reports of cases for tetanus and diphtheria have dropped by about 99% and for pertussis by about 80%. 2. Tdap vaccine Tdap vaccine can protect adolescents and adults from tetanus, diphtheria, and pertussis. One dose of Tdap is routinely given at age 89 or 48. People who did not get Tdap at that  age should get it as soon as possible. Tdap is especially important for healthcare professionals and anyone having close contact with a baby younger than 12 months. Pregnant women should get a dose of Tdap during every pregnancy, to protect the newborn from pertussis. Infants are most at risk for severe, life-threatening complications from pertussis. Another vaccine, called Td, protects against tetanus and diphtheria, but not pertussis. A Td booster should be given every 10 years. Tdap may be given as one of these boosters if you have never gotten Tdap before. Tdap may also be given after a severe cut or burn to prevent tetanus infection. Your doctor or the person giving you the vaccine can give you more information. Tdap may safely be given at the same time as other vaccines. 3. Some people should not get this vaccine  A person who has ever had a life-threatening allergic reaction after a previous dose of any diphtheria, tetanus or pertussis containing vaccine, OR has a severe allergy to any part of this vaccine, should not get Tdap vaccine. Tell the person giving the vaccine about any severe allergies.  Anyone who had coma or long repeated seizures within 7 days after a childhood dose of DTP or DTaP, or a previous dose of Tdap, should not get Tdap, unless a cause other than the vaccine was found. They can still get Td.  Talk to your doctor if you: ? have seizures or another nervous system problem, ? had severe pain or swelling after any vaccine containing diphtheria, tetanus or pertussis, ? ever had a condition called Guillain-Barr Syndrome (GBS), ? aren't feeling well on the day  the shot is scheduled. 4. Risks With any medicine, including vaccines, there is a chance of side effects. These are usually mild and go away on their own. Serious reactions are also possible but are rare. Most people who get Tdap vaccine do not have any problems with it. Mild problems following Tdap: (Did not  interfere with activities)  Pain where the shot was given (about 3 in 4 adolescents or 2 in 3 adults)  Redness or swelling where the shot was given (about 1 person in 5)  Mild fever of at least 100.66F (up to about 1 in 25 adolescents or 1 in 100 adults)  Headache (about 3 or 4 people in 10)  Tiredness (about 1 person in 3 or 4)  Nausea, vomiting, diarrhea, stomach ache (up to 1 in 4 adolescents or 1 in 10 adults)  Chills, sore joints (about 1 person in 10)  Body aches (about 1 person in 3 or 4)  Rash, swollen glands (uncommon)  Moderate problems following Tdap: (Interfered with activities, but did not require medical attention)  Pain where the shot was given (up to 1 in 5 or 6)  Redness or swelling where the shot was given (up to about 1 in 16 adolescents or 1 in 12 adults)  Fever over 102F (about 1 in 100 adolescents or 1 in 250 adults)  Headache (about 1 in 7 adolescents or 1 in 10 adults)  Nausea, vomiting, diarrhea, stomach ache (up to 1 or 3 people in 100)  Swelling of the entire arm where the shot was given (up to about 1 in 500).  Severe problems following Tdap: (Unable to perform usual activities; required medical attention)  Swelling, severe pain, bleeding and redness in the arm where the shot was given (rare).  Problems that could happen after any vaccine:  People sometimes faint after a medical procedure, including vaccination. Sitting or lying down for about 15 minutes can help prevent fainting, and injuries caused by a fall. Tell your doctor if you feel dizzy, or have vision changes or ringing in the ears.  Some people get severe pain in the shoulder and have difficulty moving the arm where a shot was given. This happens very rarely.  Any medication can cause a severe allergic reaction. Such reactions from a vaccine are very rare, estimated at fewer than 1 in a million doses, and would happen within a few minutes to a few hours after the vaccination. As  with any medicine, there is a very remote chance of a vaccine causing a serious injury or death. The safety of vaccines is always being monitored. For more information, visit: http://www.aguilar.org/ 5. What if there is a serious problem? What should I look for? Look for anything that concerns you, such as signs of a severe allergic reaction, very high fever, or unusual behavior. Signs of a severe allergic reaction can include hives, swelling of the face and throat, difficulty breathing, a fast heartbeat, dizziness, and weakness. These would usually start a few minutes to a few hours after the vaccination. What should I do?  If you think it is a severe allergic reaction or other emergency that can't wait, call 9-1-1 or get the person to the nearest hospital. Otherwise, call your doctor.  Afterward, the reaction should be reported to the Vaccine Adverse Event Reporting System (VAERS). Your doctor might file this report, or you can do it yourself through the VAERS web site at www.vaers.SamedayNews.es, or by calling 574-215-1221. ? VAERS does not give medical  advice. 6. The National Vaccine Injury Compensation Program The [employer] Vaccine Injury Compensation Program (VICP) is a federal program that was created to compensate people who may have been injured by certain vaccines. Persons who believe they may have been injured by a vaccine can learn about the program and about filing a claim by calling 231-398-5101 or visiting the [place] website at GoldCloset.com.ee. There is a time limit to file a claim for compensation. 7. How can I learn more?  Ask your doctor. He or she can give you the vaccine package insert or suggest other sources of information.  Call your local or state health department.  Contact the Centers for Disease Control and Prevention (CDC): ? Call 201-124-1225 (1-800-CDC-INFO) or ? Visit CDC's website at http://hunter.com/ CDC Tdap Vaccine VIS (12/09/13) This  information is not intended to replace advice given to you by your health care provider. Make sure you discuss any questions you have with your health care provider. Document Released: 04/02/2012 Document Revised: 06/22/2016 Document Reviewed: 06/22/2016 Elsevier Interactive Patient Education  2017 [employer].

## 2018-04-12 NOTE — Progress Notes (Signed)
   PRENATAL VISIT NOTE  Subjective:  Alyssa Davidson is a 32 y.o. G1P0 at [redacted]w[redacted]d being seen today for ongoing prenatal care.  She is currently monitored for the following issues for this low-risk pregnancy and has Supervision of other normal pregnancy, antepartum and Sickle cell trait ([place]) on their problem list.  Patient reports no complaints.  Contractions: Not present. Vag. Bleeding: None.  Movement: Present. Denies leaking of fluid.   The following portions of the patient's history were reviewed and updated as appropriate: allergies, current medications, past family history, past medical history, past social history, past surgical history and problem list. Problem list updated.  Objective:   Vitals:   04/12/18 0918  BP: 132/84  Pulse: (!) 103  Weight: 224 lb 9.6 oz (101.9 kg)    Fetal Status: Fetal Heart Rate (bpm): 154 Fundal Height: 27 cm Movement: Present     General:  Alert, oriented and cooperative. Patient is in no acute distress.  Skin: Skin is warm and dry. No rash noted.   Cardiovascular: Normal heart rate noted  Respiratory: Normal respiratory effort, no problems with respiration noted  Abdomen: Soft, gravid, appropriate for gestational age.  Pain/Pressure: Absent     Pelvic: Cervical exam deferred        Extremities: Normal range of motion.  Edema: Trace  Mental Status: Normal mood and affect. Normal behavior. Normal judgment and thought content.   Assessment and Plan:  Pregnancy: G1P0 at [redacted]w[redacted]d  1. Supervision of other normal pregnancy, antepartum - Tdap vaccine greater than or equal to 7yo IM - Enroll Patient in [employer]cripts - Reviewed 28 wk labs (CBC, RPR, HIV > nml); 2 hr results pending. - Discussed warning signs of pregnancy  Preterm labor symptoms and general obstetric precautions including but not limited to vaginal bleeding, contractions, leaking of fluid and fetal movement were reviewed in detail with the patient. Please refer to After Visit Summary for  other counseling recommendations.  Return in about 2 weeks (around 04/26/2018).  No future appointments.  Kathrine Haddock, CNM

## 2018-04-16 ENCOUNTER — Encounter: Payer: Self-pay | Admitting: Family

## 2018-04-16 ENCOUNTER — Other Ambulatory Visit: Payer: Self-pay | Admitting: Family

## 2018-04-16 DIAGNOSIS — O0992 Supervision of high risk pregnancy, unspecified, second trimester: Secondary | ICD-10-CM

## 2018-04-16 DIAGNOSIS — O24419 Gestational diabetes mellitus in pregnancy, unspecified control: Secondary | ICD-10-CM | POA: Insufficient documentation

## 2018-04-19 ENCOUNTER — Other Ambulatory Visit: Payer: Self-pay | Admitting: General Practice

## 2018-04-19 ENCOUNTER — Other Ambulatory Visit: Payer: Self-pay

## 2018-04-19 DIAGNOSIS — O24419 Gestational diabetes mellitus in pregnancy, unspecified control: Secondary | ICD-10-CM

## 2018-04-19 DIAGNOSIS — O0992 Supervision of high risk pregnancy, unspecified, second trimester: Secondary | ICD-10-CM

## 2018-04-19 MED ORDER — ACCU-CHEK FASTCLIX LANCETS MISC: 1.0000 | Freq: Four times a day (QID) | 12 refills | Status: DC

## 2018-04-19 MED ORDER — GLUCOSE BLOOD VI STRP: ORAL_STRIP | 12 refills | Status: DC

## 2018-04-19 MED ORDER — ACCU-CHEK AVIVA PLUS W/DEVICE KIT: 1.0000 | PACK | Freq: Four times a day (QID) | 0 refills | Status: DC

## 2018-04-24 ENCOUNTER — Encounter: Payer: 59 | Attending: Family | Admitting: Registered"

## 2018-04-24 DIAGNOSIS — O24419 Gestational diabetes mellitus in pregnancy, unspecified control: Secondary | ICD-10-CM | POA: Diagnosis present

## 2018-04-24 DIAGNOSIS — Z713 Dietary counseling and surveillance: Secondary | ICD-10-CM | POA: Diagnosis present

## 2018-04-26 ENCOUNTER — Encounter: Payer: Self-pay | Admitting: Registered"

## 2018-04-26 ENCOUNTER — Ambulatory Visit (INDEPENDENT_AMBULATORY_CARE_PROVIDER_SITE_OTHER): Payer: 59 | Admitting: Family

## 2018-04-26 ENCOUNTER — Encounter: Payer: Self-pay | Admitting: Family

## 2018-04-26 VITALS — BP 120/78 | HR 103 | Wt 228.4 lb

## 2018-04-26 DIAGNOSIS — O24419 Gestational diabetes mellitus in pregnancy, unspecified control: Secondary | ICD-10-CM

## 2018-04-26 DIAGNOSIS — Z3480 Encounter for supervision of other normal pregnancy, unspecified trimester: Secondary | ICD-10-CM

## 2018-04-26 NOTE — Progress Notes (Signed)
Patient was seen on 04/24/2018 for Gestational Diabetes self-management class at the [facility]. The following learning objectives were met by the patient during this course:   States the definition of Gestational Diabetes  States why dietary management is important in controlling blood glucose  Describes the effects each nutrient has on blood glucose levels  Demonstrates ability to create a balanced meal plan  Demonstrates carbohydrate counting   States when to check blood glucose levels  Demonstrates proper blood glucose monitoring techniques  States the effect of stress and exercise on blood glucose levels  States the importance of limiting caffeine and abstaining from alcohol and smoking  Blood glucose monitor given: [employer] Lot # [id number] X Exp: 03/16/19 Blood glucose reading: 140  Patient instructed to monitor glucose levels: FBS: 60 - <95; 1 hour: <140; 2 hour: <120  Patient received handouts:  Nutrition Diabetes and Pregnancy, including carb counting list  Patient will be seen for follow-up as needed.

## 2018-04-26 NOTE — Progress Notes (Signed)
   PRENATAL VISIT NOTE  Subjective:  Alyssa Davidson is a 32 y.o. G1P0 at [redacted]w[redacted]d being seen today for ongoing prenatal care.  She is currently monitored for the following issues for this high-risk pregnancy and has Supervision of other normal pregnancy, antepartum; Sickle cell trait ([place]); and Gestational diabetes mellitus (GDM) affecting first pregnancy on their problem list.  Patient reports fatigue.  Attended diabetes education class this past Wednesday.  Started checking CBGs yesterday.  Contractions: Not present. Vag. Bleeding: None.  Movement: Present. Denies leaking of fluid.   The following portions of the patient's history were reviewed and updated as appropriate: allergies, current medications, past family history, past medical history, past social history, past surgical history and problem list. Problem list updated.  Objective:   Vitals:   04/26/18 1100 04/26/18 1112  BP: 120/77 120/78  Pulse: (!) 103 (!) 103  Weight: 228 lb (103.4 kg) 228 lb 6.4 oz (103.6 kg)   First full day checking yesterday: Fasting PPB PPL PPD  83-89 119 102 122    Fetal Status: Fetal Heart Rate (bpm): 158 Fundal Height: 31 cm Movement: Present     General:  Alert, oriented and cooperative. Patient is in no acute distress.  Skin: Skin is warm and dry. No rash noted.   Cardiovascular: Normal heart rate noted  Respiratory: Normal respiratory effort, no problems with respiration noted  Abdomen: Soft, gravid, appropriate for gestational age.  Pain/Pressure: Absent     Pelvic: Cervical exam deferred        Extremities: Normal range of motion.  Edema: Trace  Mental Status: Normal mood and affect. Normal behavior. Normal judgment and thought content.   Assessment and Plan:  Pregnancy: G1P0 at [redacted]w[redacted]d  1. Gestational diabetes mellitus (GDM) affecting first pregnancy - Continue checking CBGs - Enrolled in [employer]s  Preterm labor symptoms and general obstetric precautions including but not limited to  vaginal bleeding, contractions, leaking of fluid and fetal movement were reviewed in detail with the patient. Please refer to After Visit Summary for other counseling recommendations.  Return in about 2 weeks (around 05/10/2018).  No future appointments.  Kathrine Haddock, CNM

## 2018-04-30 ENCOUNTER — Other Ambulatory Visit: Payer: Self-pay | Admitting: Family

## 2018-04-30 DIAGNOSIS — O24419 Gestational diabetes mellitus in pregnancy, unspecified control: Secondary | ICD-10-CM

## 2018-04-30 MED ORDER — BLOOD GLUCOSE MONITOR KIT: PACK | 12 refills | Status: DC

## 2018-04-30 MED ORDER — GLUCOSE BLOOD VI STRP: ORAL_STRIP | 12 refills | Status: DC

## 2018-05-10 ENCOUNTER — Ambulatory Visit (INDEPENDENT_AMBULATORY_CARE_PROVIDER_SITE_OTHER): Payer: 59 | Admitting: Family

## 2018-05-10 VITALS — BP 110/74 | HR 103 | Wt 230.0 lb

## 2018-05-10 DIAGNOSIS — O3663X0 Maternal care for excessive fetal growth, third trimester, not applicable or unspecified: Secondary | ICD-10-CM

## 2018-05-10 DIAGNOSIS — O24410 Gestational diabetes mellitus in pregnancy, diet controlled: Secondary | ICD-10-CM

## 2018-05-10 DIAGNOSIS — Z3A33 33 weeks gestation of pregnancy: Secondary | ICD-10-CM

## 2018-05-10 NOTE — Progress Notes (Signed)
       PRENATAL VISIT NOTE  Subjective:  Alyssa Davidson is a 32 y.o. G1P0 at [redacted]w[redacted]d being seen today for ongoing prenatal care.  She is currently monitored for the following issues for this high-risk pregnancy and has Supervision of high risk pregnancy, antepartum; Sickle cell trait ([place]); and Gestational diabetes mellitus (GDM) affecting first pregnancy on their problem list.  Patient reports no complaints.  CBG's within normal limits.  Did not check breakfast due to being out of lancets.  Good fetal movement.   Contractions: Not present. Vag. Bleeding: None.  Movement: Present. Denies leaking of fluid.   The following portions of the patient's history were reviewed and updated as appropriate: allergies, current medications, past family history, past medical history, past social history, past surgical history and problem list. Problem list updated.  Objective:   Vitals:   05/10/18 0914 05/10/18 0919  BP: 130/84 110/74  Pulse: (!) 103   Weight: 230 lb (104.3 kg)     Fetal Status: Fetal Heart Rate (bpm): 145 Fundal Height: 35 cm Movement: Present     General:  Alert, oriented and cooperative. Patient is in no acute distress.  Skin: Skin is warm and dry. No rash noted.   Cardiovascular: Normal heart rate noted  Respiratory: Normal respiratory effort, no problems with respiration noted  Abdomen: Soft, gravid, appropriate for gestational age.  Pain/Pressure: Absent     Pelvic: Cervical exam deferred        Extremities: Normal range of motion.  Edema: Trace  Mental Status: Normal mood and affect. Normal behavior. Normal judgment and thought content.   Assessment and Plan:  Pregnancy: G1P0 at [redacted]w[redacted]d  1. Excessive fetal growth affecting management of pregnancy in third trimester, single or unspecified fetus - [place] MFM OB FOLLOW UP; Future  2. [redacted] weeks gestation of pregnancy - [place] MFM OB FOLLOW UP; Future  3. Diet controlled gestational diabetes mellitus (GDM) in third trimester -  Continue monitoring CBGs - Follow-up US to assess growth  Preterm labor symptoms and general obstetric precautions including but not limited to vaginal bleeding, contractions, leaking of fluid and fetal movement were reviewed in detail with the patient. Please refer to After Visit Summary for other counseling recommendations.  Return in about 2 weeks (around 05/24/2018).  Future Appointments  Date Time Provider [place]  05/14/2018  1:45 PM [place] 2 [id number] MFC-US  05/24/2018  8:50 AM Gwen Pounds, [place] None    Kathrine Haddock, CNM

## 2018-05-14 ENCOUNTER — Ambulatory Visit (HOSPITAL_COMMUNITY)
Admission: RE | Admit: 2018-05-14 | Discharge: 2018-05-14 | Disposition: A | Discharge status: Home / Self Care | Payer: 59 | Source: Ambulatory Visit | Attending: Family | Admitting: Family

## 2018-05-14 ENCOUNTER — Encounter (HOSPITAL_COMMUNITY): Payer: Self-pay

## 2018-05-14 DIAGNOSIS — O0990 Supervision of high risk pregnancy, unspecified, unspecified trimester: Secondary | ICD-10-CM

## 2018-05-14 DIAGNOSIS — O3663X0 Maternal care for excessive fetal growth, third trimester, not applicable or unspecified: Secondary | ICD-10-CM | POA: Diagnosis not present

## 2018-05-14 DIAGNOSIS — O24410 Gestational diabetes mellitus in pregnancy, diet controlled: Secondary | ICD-10-CM | POA: Diagnosis not present

## 2018-05-14 DIAGNOSIS — Z3A33 33 weeks gestation of pregnancy: Secondary | ICD-10-CM | POA: Diagnosis not present

## 2018-05-14 DIAGNOSIS — Z362 Encounter for other antenatal screening follow-up: Secondary | ICD-10-CM | POA: Diagnosis not present

## 2018-05-14 DIAGNOSIS — O24419 Gestational diabetes mellitus in pregnancy, unspecified control: Secondary | ICD-10-CM

## 2018-05-14 HISTORY — DX: Gestational diabetes mellitus in pregnancy, unspecified control: O24.419

## 2018-05-24 ENCOUNTER — Ambulatory Visit (INDEPENDENT_AMBULATORY_CARE_PROVIDER_SITE_OTHER): Payer: 59 | Admitting: Family

## 2018-05-24 ENCOUNTER — Other Ambulatory Visit: Payer: Self-pay

## 2018-05-24 VITALS — BP 117/80 | HR 116 | Wt 232.0 lb

## 2018-05-24 DIAGNOSIS — Z3A37 37 weeks gestation of pregnancy: Secondary | ICD-10-CM

## 2018-05-24 DIAGNOSIS — O24410 Gestational diabetes mellitus in pregnancy, diet controlled: Secondary | ICD-10-CM

## 2018-05-24 DIAGNOSIS — O24419 Gestational diabetes mellitus in pregnancy, unspecified control: Secondary | ICD-10-CM

## 2018-05-24 LAB — POCT URINALYSIS DIPSTICK OB
Bilirubin, UA: NEGATIVE
Glucose, UA: NEGATIVE — AB
KETONES UA: NEGATIVE
Nitrite, UA: NEGATIVE
PH UA: 6.5 (ref 5.0–8.0)
POC,PROTEIN,UA: NEGATIVE
SPEC GRAV UA: 1.015 (ref 1.010–1.025)
UROBILINOGEN UA: 0.2 U/dL

## 2018-05-24 MED ORDER — BREAST PUMP MISC: 1.0000 [IU] | Freq: Once | 0 refills | Status: AC

## 2018-05-24 NOTE — Progress Notes (Signed)
Reviewed glucose results; majority within normal limits; did not check on Saturday due to having baby shower.

## 2018-05-24 NOTE — Patient Instructions (Signed)
Group B Streptococcus Infection During Pregnancy Group B Streptococcus (GBS) is a type of bacteria (Streptococcus agalactiae) that is often found in healthy people, commonly in the rectum, vagina, and intestines. In people who are healthy and not pregnant, the bacteria rarely cause serious illness or complications. However, women who test positive for GBS during pregnancy can pass the bacteria to their baby during childbirth, which can cause serious infection in the baby after birth. Women with GBS may also have infections during their pregnancy or immediately after childbirth, such as such as urinary tract infections (UTIs) or infections of the uterus (uterine infections). Having GBS also increases a woman's risk of complications during pregnancy, such as early (preterm) labor or delivery, miscarriage, or stillbirth. Routine testing (screening) for GBS is recommended for all pregnant women. What increases the risk? You may have a higher risk for GBS infection during pregnancy if you had one during a past pregnancy. What are the signs or symptoms? In most cases, GBS infection does not cause symptoms in pregnant women. Signs and symptoms of a possible GBS-related infection may include:  Labor starting before the 37th week of pregnancy.  A UTI or bladder infection, which may cause: ? Fever. ? Pain or burning during urination. ? Frequent urination.  Fever during labor, along with: ? Bad-smelling discharge. ? Uterine tenderness. ? Rapid heartbeat in the mother, baby, or both.  Rare but serious symptoms of a possible GBS-related infection in women include:  Blood infection (septicemia). This may cause fever, chills, or confusion.  Lung infection (pneumonia). This may cause fever, chills, cough, rapid breathing, difficulty breathing, or chest pain.  Bone, joint, skin, or soft tissue infection.  How is this diagnosed? You may be screened for GBS between week 35 and week 37 of your pregnancy. If  you have symptoms of preterm labor, you may be screened earlier. This condition is diagnosed based on lab test results from:  A swab of fluid from the vagina and rectum.  A urine sample.  How is this treated? This condition is treated with antibiotic medicine. When you go into labor, or as soon as your water breaks (your membranes rupture), you will be given antibiotics through an IV tube. Antibiotics will continue until after you give birth. If you are having a cesarean delivery, you do not need antibiotics unless your membranes have already ruptured. Follow these instructions at home:  Take over-the-counter and prescription medicines only as told by your health care provider.  Take your antibiotic medicine as told by your health care provider. Do not stop taking the antibiotic even if you start to feel better.  Keep all pre-birth (prenatal) visits and follow-up visits as told by your health care provider. This is important. Contact a health care provider if:  You have pain or burning when you urinate.  You have to urinate frequently.  You have a fever or chills.  You develop a bad-smelling vaginal discharge. Get help right away if:  Your membranes rupture.  You go into labor.  You have severe pain in your abdomen.  You have difficulty breathing.  You have chest pain. This information is not intended to replace advice given to you by your health care provider. Make sure you discuss any questions you have with your health care provider. Document Released: 01/09/2008 Document Revised: 04/28/2016 Document Reviewed: 04/27/2016 Elsevier Interactive Patient Education  2018 Elsevier Inc.  

## 2018-05-24 NOTE — Progress Notes (Signed)
     PRENATAL VISIT NOTE  Subjective:  Alyssa Davidson is a 32 y.o. G1P0 at 102w2d being seen today for ongoing prenatal care.  Baby Shower over the weekend, went well.  She is currently monitored for the following issues for this high-risk pregnancy and has Supervision of high risk pregnancy, antepartum; Sickle cell trait ([place]); and Gestational diabetes mellitus (GDM) affecting first pregnancy on their problem list.  Patient reports no complaints.  Contractions: Irritability. Vag. Bleeding: None.  Movement: Present. Denies leaking of fluid.   The following portions of the patient's history were reviewed and updated as appropriate: allergies, current medications, past family history, past medical history, past social history, past surgical history and problem list. Problem list updated.  Objective:   Vitals:   05/24/18 0900  BP: 117/80  Pulse: (!) 116  Weight: 232 lb (105.2 kg)    Fetal Status: Fetal Heart Rate (bpm): 145 Fundal Height: 36 cm Movement: Present     General:  Alert, oriented and cooperative. Patient is in no acute distress.  Skin: Skin is warm and dry. No rash noted.   Cardiovascular: Normal heart rate noted  Respiratory: Normal respiratory effort, no problems with respiration noted  Abdomen: Soft, gravid, appropriate for gestational age.  Pain/Pressure: Present     Pelvic: Cervical exam deferred        Extremities: Normal range of motion.  Edema: Mild pitting, slight indentation  Mental Status: Normal mood and affect. Normal behavior. Normal judgment and thought content.   Assessment and Plan:  Pregnancy: G1P0 at [redacted]w[redacted]d  1. Gestational diabetes mellitus (GDM) affecting first pregnancy - POC Urinalysis Dipstick OB - US MFM OB FOLLOW UP; Future  2. Supervision of Normal Pregnancy - Reviewed GBS test for next visit - Reviewed ultrasound results (growth 77%ile, low normal fluid)  Preterm labor symptoms and general obstetric precautions including but not limited to  vaginal bleeding, contractions, leaking of fluid and fetal movement were reviewed in detail with the patient. Please refer to After Visit Summary for other counseling recommendations.  Return in 2 weeks (on 06/07/2018).  Future Appointments  Date Time Provider [place]  06/07/2018  9:10 AM Gwen Pounds, CNM [facility] None  06/11/2018 11:15 AM [place]    Kathrine Haddock, CNM

## 2018-06-07 ENCOUNTER — Ambulatory Visit (INDEPENDENT_AMBULATORY_CARE_PROVIDER_SITE_OTHER): Payer: 59 | Admitting: Family

## 2018-06-07 VITALS — BP 116/79 | HR 114 | Wt 233.0 lb

## 2018-06-07 DIAGNOSIS — O0993 Supervision of high risk pregnancy, unspecified, third trimester: Secondary | ICD-10-CM

## 2018-06-07 DIAGNOSIS — Z23 Encounter for immunization: Secondary | ICD-10-CM | POA: Diagnosis not present

## 2018-06-07 DIAGNOSIS — Z113 Encounter for screening for infections with a predominantly sexual mode of transmission: Secondary | ICD-10-CM

## 2018-06-07 DIAGNOSIS — O24419 Gestational diabetes mellitus in pregnancy, unspecified control: Secondary | ICD-10-CM

## 2018-06-07 DIAGNOSIS — O3663X0 Maternal care for excessive fetal growth, third trimester, not applicable or unspecified: Secondary | ICD-10-CM

## 2018-06-07 DIAGNOSIS — O0990 Supervision of high risk pregnancy, unspecified, unspecified trimester: Secondary | ICD-10-CM

## 2018-06-07 NOTE — Patient Instructions (Signed)

## 2018-06-07 NOTE — Progress Notes (Signed)
      PRENATAL VISIT NOTE  Subjective:  Alyssa Davidson is a 32 y.o. G1P0 at [redacted]w[redacted]d being seen today for ongoing prenatal care.  She is currently monitored for the following issues for this high-risk pregnancy and has Supervision of high risk pregnancy, antepartum; Sickle cell trait ([place]); and Gestational diabetes mellitus (GDM) affecting first pregnancy on their problem list.  Patient reports lower pelvic pressure.  Contractions: Irregular. Vag. Bleeding: None.  Movement: Present. Denies leaking of fluid.   The following portions of the patient's history were reviewed and updated as appropriate: allergies, current medications, past family history, past medical history, past social history, past surgical history and problem list. Problem list updated.  Objective:   Vitals:   06/07/18 0912  BP: 116/79  Pulse: (!) 114  Weight: 233 lb (105.7 kg)    Fetal Status: Fetal Heart Rate (bpm): 142 Fundal Height: 37 cm Movement: Present  Presentation: Vertex  General:  Alert, oriented and cooperative. Patient is in no acute distress.  Skin: Skin is warm and dry. No rash noted.   Cardiovascular: Normal heart rate noted  Respiratory: Normal respiratory effort, no problems with respiration noted  Abdomen: Soft, gravid, appropriate for gestational age.  Pain/Pressure: Present     Pelvic: Cervical exam performed Dilation: Closed Effacement (%): 50 Station: Ballotable  Extremities: Normal range of motion.  Edema: Mild pitting, slight indentation  Mental Status: Normal mood and affect. Normal behavior. Normal judgment and thought content.   Assessment and Plan:  Pregnancy: G1P0 at [redacted]w[redacted]d  1. Gestational diabetes mellitus (GDM) affecting first pregnancy - BS wnl; doing well - Growth ultrasound scheduled for Tuesday - Discussed IOL at 40 wks; will schedule for 07/03/18  2. Supervision of high risk pregnancy, antepartum - Culture, beta strep (group b only) - Cervicovaginal ancillary only  3. Need  for immunization against influenza - Flu Vaccine QUAD 36+ mos IM  Preterm labor symptoms and general obstetric precautions including but not limited to vaginal bleeding, contractions, leaking of fluid and fetal movement were reviewed in detail with the patient. Please refer to After Visit Summary for other counseling recommendations.  Return in about 1 week (around 06/14/2018).  Future Appointments  Date Time Provider [place]  06/11/2018 11:15 AM [place] [facility]  06/13/2018  4:30 PM Laury Deep, [place] None    Kathrine Haddock, CNM

## 2018-06-10 LAB — CERVICOVAGINAL ANCILLARY ONLY
CHLAMYDIA, DNA PROBE: NEGATIVE
Neisseria Gonorrhea: NEGATIVE

## 2018-06-11 ENCOUNTER — Encounter (HOSPITAL_COMMUNITY): Payer: Self-pay

## 2018-06-11 ENCOUNTER — Telehealth: Payer: Self-pay | Admitting: *Deleted

## 2018-06-11 ENCOUNTER — Ambulatory Visit (HOSPITAL_COMMUNITY)
Admission: RE | Admit: 2018-06-11 | Discharge: 2018-06-11 | Disposition: A | Discharge status: Home / Self Care | Payer: 59 | Source: Ambulatory Visit | Attending: Family | Admitting: Family

## 2018-06-11 ENCOUNTER — Other Ambulatory Visit: Payer: Self-pay | Admitting: Family

## 2018-06-11 ENCOUNTER — Encounter: Payer: Self-pay | Admitting: General Practice

## 2018-06-11 ENCOUNTER — Other Ambulatory Visit (HOSPITAL_COMMUNITY): Payer: Self-pay | Admitting: *Deleted

## 2018-06-11 DIAGNOSIS — Z362 Encounter for other antenatal screening follow-up: Secondary | ICD-10-CM

## 2018-06-11 DIAGNOSIS — O24419 Gestational diabetes mellitus in pregnancy, unspecified control: Secondary | ICD-10-CM

## 2018-06-11 DIAGNOSIS — Z3A36 36 weeks gestation of pregnancy: Secondary | ICD-10-CM | POA: Insufficient documentation

## 2018-06-11 DIAGNOSIS — Z3A37 37 weeks gestation of pregnancy: Secondary | ICD-10-CM

## 2018-06-11 DIAGNOSIS — O3660X0 Maternal care for excessive fetal growth, unspecified trimester, not applicable or unspecified: Secondary | ICD-10-CM | POA: Insufficient documentation

## 2018-06-11 DIAGNOSIS — O3663X0 Maternal care for excessive fetal growth, third trimester, not applicable or unspecified: Secondary | ICD-10-CM

## 2018-06-11 DIAGNOSIS — O24410 Gestational diabetes mellitus in pregnancy, diet controlled: Secondary | ICD-10-CM

## 2018-06-11 DIAGNOSIS — O99213 Obesity complicating pregnancy, third trimester: Secondary | ICD-10-CM

## 2018-06-11 LAB — CULTURE, BETA STREP (GROUP B ONLY): Strep Gp B Culture: NEGATIVE

## 2018-06-11 NOTE — Telephone Encounter (Signed)
-----   Message from Gwen Pounds, CNM sent at 06/11/2018  2:52 PM EDT ----- Regarding: Induction Date Dr. Donalee Citrin wrote in his ultrasound resutl that he wanted her induced at 39 weeks.  Please change induction date to evening of 9/10 (midnight) ----- Message ----- From: Interface, Rad Results In Sent: 06/11/2018   1:46 PM EDT To: [id number] Michiel Cowboy, CNM

## 2018-06-11 NOTE — Telephone Encounter (Signed)
Pt informed that induction of labor is schedule for midnight 06/26/18 at 39 weeks per Kathrine Haddock, CNM via foley bulb. Please review ultrasound report: Consider delivery at 39 weeks if diabetes is well controlled per Dr. Donalee Citrin. Venia Carbon Eliot Ford, RN

## 2018-06-12 ENCOUNTER — Telehealth (HOSPITAL_COMMUNITY): Payer: Self-pay | Admitting: *Deleted

## 2018-06-12 NOTE — Telephone Encounter (Signed)
Preadmission screen  

## 2018-06-13 ENCOUNTER — Ambulatory Visit (INDEPENDENT_AMBULATORY_CARE_PROVIDER_SITE_OTHER): Payer: 59 | Admitting: Obstetrics and Gynecology

## 2018-06-13 VITALS — BP 128/84 | HR 112 | Wt 233.0 lb

## 2018-06-13 DIAGNOSIS — O0990 Supervision of high risk pregnancy, unspecified, unspecified trimester: Secondary | ICD-10-CM

## 2018-06-13 DIAGNOSIS — O24419 Gestational diabetes mellitus in pregnancy, unspecified control: Secondary | ICD-10-CM

## 2018-06-13 NOTE — Progress Notes (Signed)
   PRENATAL VISIT NOTE  Subjective:  Alyssa Davidson is a 32 y.o. G1P0 at [redacted]w[redacted]d being seen today for ongoing prenatal care.  She is currently monitored for the following issues for this high-risk pregnancy and has Supervision of high risk pregnancy, antepartum; Sickle cell trait ([employer]); Gestational diabetes mellitus (GDM) affecting first pregnancy; and LGA (large for gestational age) fetus affecting management of mother on their problem list.  Patient reports no complaints.  Contractions: Irregular. Vag. Bleeding: None.  Movement: Present. Denies leaking of fluid.   The following portions of the patient's history were reviewed and updated as appropriate: allergies, current medications, past family history, past medical history, past social history, past surgical history and problem list. Problem list updated.  Objective:   Vitals:   06/13/18 1642  BP: 128/84  Pulse: (!) 112  Weight: 233 lb (105.7 kg)    Fetal Status: Fetal Heart Rate (bpm): 146 Fundal Height: 36 cm Movement: Present  Presentation: Vertex  General:  Alert, oriented and cooperative. Patient is in no acute distress.  Skin: Skin is warm and dry. No rash noted.   Cardiovascular: Normal heart rate noted  Respiratory: Normal respiratory effort, no problems with respiration noted  Abdomen: Soft, gravid, appropriate for gestational age.  Pain/Pressure: Absent     Pelvic: Cervical exam deferred        Extremities: Normal range of motion.  Edema: Mild pitting, slight indentation  Mental Status: Normal mood and affect. Normal behavior. Normal judgment and thought content.   Assessment and Plan:  Pregnancy: G1P0 at [redacted]w[redacted]d  Gestational diabetes mellitus (GDM) affecting first pregnancy - BS log reviewed on Babyscripts - all WNL  Supervision of high risk pregnancy, antepartum - Discussed methods for IOL - Changed IOL appt to 9/12 @ 0730 per pt request (previous appt time was 9/10 at midnight)  Preterm labor symptoms and general  obstetric precautions including but not limited to vaginal bleeding, contractions, leaking of fluid and fetal movement were reviewed in detail with the patient. Please refer to After Visit Summary for other counseling recommendations.  Return in about 1 week (around 06/20/2018) for Return OB visit.  Future Appointments  Date Time Provider [place]  06/20/2018  1:15 PM Laury Deep, CNM [facility] None  06/26/2018 12:00 AM [facility] ROOM [facility] None  06/27/2018  7:30 AM [facility] ROOM [facility] None    Laury Deep, CNM

## 2018-06-13 NOTE — Progress Notes (Signed)
Glucose Report  Export    Alyssa Davidson  [phone] [email or web address] MRN  Primary Caregiver: Kathrine Haddock  Unusual Readings 0  Missed Readings 0  Entry Notes 0  Week of 2018/06/13   FASTING BREAKFAST LUNCH DINNER  MON AUG 26  Show notes       114   TUE AUG 27  Show notes 79  96  110  107   WED AUG 28  Show notes 83  93  77  105   THU AUG 29  Show notes 81  98  99

## 2018-06-18 ENCOUNTER — Encounter (HOSPITAL_COMMUNITY): Payer: Self-pay

## 2018-06-18 ENCOUNTER — Ambulatory Visit (HOSPITAL_COMMUNITY)
Admission: RE | Admit: 2018-06-18 | Discharge: 2018-06-18 | Disposition: A | Discharge status: Home / Self Care | Payer: 59 | Source: Ambulatory Visit | Attending: Family | Admitting: Family

## 2018-06-18 DIAGNOSIS — O24410 Gestational diabetes mellitus in pregnancy, diet controlled: Secondary | ICD-10-CM

## 2018-06-18 DIAGNOSIS — Z3A37 37 weeks gestation of pregnancy: Secondary | ICD-10-CM | POA: Diagnosis not present

## 2018-06-18 DIAGNOSIS — O99213 Obesity complicating pregnancy, third trimester: Secondary | ICD-10-CM | POA: Diagnosis not present

## 2018-06-18 DIAGNOSIS — O3663X0 Maternal care for excessive fetal growth, third trimester, not applicable or unspecified: Secondary | ICD-10-CM

## 2018-06-18 DIAGNOSIS — Z362 Encounter for other antenatal screening follow-up: Secondary | ICD-10-CM | POA: Diagnosis not present

## 2018-06-20 ENCOUNTER — Ambulatory Visit (INDEPENDENT_AMBULATORY_CARE_PROVIDER_SITE_OTHER): Payer: 59 | Admitting: Obstetrics and Gynecology

## 2018-06-20 ENCOUNTER — Encounter: Payer: Self-pay | Admitting: Obstetrics and Gynecology

## 2018-06-20 VITALS — BP 135/81 | HR 109 | Wt 231.8 lb

## 2018-06-20 DIAGNOSIS — O24419 Gestational diabetes mellitus in pregnancy, unspecified control: Secondary | ICD-10-CM

## 2018-06-20 DIAGNOSIS — O0990 Supervision of high risk pregnancy, unspecified, unspecified trimester: Secondary | ICD-10-CM

## 2018-06-20 NOTE — Progress Notes (Signed)
   PRENATAL VISIT NOTE  Subjective:  Nykira Reddix is a 32 y.o. G1P0 at [redacted]w[redacted]d being seen today for ongoing prenatal care.  She is currently monitored for the following issues for this high-risk pregnancy and has Supervision of high risk pregnancy, antepartum; Sickle cell trait ([place]); Gestational diabetes mellitus (GDM) affecting first pregnancy; and LGA (large for gestational age) fetus affecting management of mother on their problem list.  Patient reports increased vaginal discharge without odor, itching or burning.  Contractions: Irregular. Vag. Bleeding: None.  Movement: Present. Denies leaking of fluid.   The following portions of the patient's history were reviewed and updated as appropriate: allergies, current medications, past family history, past medical history, past social history, past surgical history and problem list. Problem list updated.  Objective:  s eyorlrgjprl Vitals:   06/20/18 1318  BP: 135/81  Pulse: (!) 109  Weight: 231 lb 12.8 oz (105.1 kg)    Fetal Status: Fetal Heart Rate (bpm): 156 Fundal Height: 39 cm Movement: Present  Presentation: Vertex  General:  Alert, oriented and cooperative. Patient is in no acute distress.  Skin: Skin is warm and dry. No rash noted.   Cardiovascular: Normal heart rate noted  Respiratory: Normal respiratory effort, no problems with respiration noted  Abdomen: Soft, gravid, appropriate for gestational age.  Pain/Pressure: Present     Pelvic: Cervical exam deferred        Extremities: Normal range of motion.  Edema: Mild pitting, slight indentation  Mental Status: Normal mood and affect. Normal behavior. Normal judgment and thought content.   Assessment and Plan:  Pregnancy: G1P0 at [redacted]w[redacted]d  1. Gestational diabetes mellitus (GDM) affecting first pregnancy - Review of Babyscripts blood sugar log: FBS range = 75-81; 2 hr PP range 90-126; no recordings for 06/19/18 PM or 06/20/18. Patient states, "I need to enter them in the system. I  wasn't able to get it to load on my phone.  2. Supervision of high risk pregnancy, antepartum - Reviewed normal BPP results from 06/19/18 - IOL scheduled for 06/27/18 at 0730 - RTO 6 wks from delivery date  Term labor symptoms and general obstetric precautions including but not limited to vaginal bleeding, contractions, leaking of fluid and fetal movement were reviewed in detail with the patient. Please refer to After Visit Summary for other counseling recommendations.  Return in about 6 weeks (around 08/01/2018) for Postpartum Visit - 2 hr GTT.  Future Appointments  Date Time Provider [place]  06/27/2018  7:30 AM [facility] ROOM [facility] None  08/01/2018  8:30 AM [place] [facility] None  08/01/2018  9:50 AM Laury Deep, CNM [facility] None    Laury Deep, CNM

## 2018-06-21 ENCOUNTER — Encounter: Payer: Self-pay | Admitting: General Practice

## 2018-06-22 ENCOUNTER — Other Ambulatory Visit: Payer: Self-pay | Admitting: Family Medicine

## 2018-06-26 ENCOUNTER — Inpatient Hospital Stay (HOSPITAL_COMMUNITY): Admission: RE | Admit: 2018-06-26 | Payer: 59 | Source: Ambulatory Visit

## 2018-06-27 ENCOUNTER — Inpatient Hospital Stay (HOSPITAL_COMMUNITY)
Admission: RE | Admit: 2018-06-27 | Discharge: 2018-06-30 | DRG: 805 | Disposition: A | Discharge status: Home / Self Care | Payer: 59 | Attending: Obstetrics & Gynecology | Admitting: Obstetrics & Gynecology

## 2018-06-27 ENCOUNTER — Encounter (HOSPITAL_COMMUNITY): Payer: Self-pay

## 2018-06-27 DIAGNOSIS — O24419 Gestational diabetes mellitus in pregnancy, unspecified control: Secondary | ICD-10-CM

## 2018-06-27 DIAGNOSIS — O411230 Chorioamnionitis, third trimester, not applicable or unspecified: Secondary | ICD-10-CM | POA: Diagnosis present

## 2018-06-27 DIAGNOSIS — O9902 Anemia complicating childbirth: Secondary | ICD-10-CM | POA: Diagnosis present

## 2018-06-27 DIAGNOSIS — O134 Gestational [pregnancy-induced] hypertension without significant proteinuria, complicating childbirth: Secondary | ICD-10-CM | POA: Diagnosis not present

## 2018-06-27 DIAGNOSIS — O3663X0 Maternal care for excessive fetal growth, third trimester, not applicable or unspecified: Secondary | ICD-10-CM | POA: Diagnosis present

## 2018-06-27 DIAGNOSIS — Z3A39 39 weeks gestation of pregnancy: Secondary | ICD-10-CM | POA: Diagnosis not present

## 2018-06-27 DIAGNOSIS — O24420 Gestational diabetes mellitus in childbirth, diet controlled: Secondary | ICD-10-CM | POA: Diagnosis present

## 2018-06-27 DIAGNOSIS — O0990 Supervision of high risk pregnancy, unspecified, unspecified trimester: Secondary | ICD-10-CM

## 2018-06-27 DIAGNOSIS — D573 Sickle-cell trait: Secondary | ICD-10-CM | POA: Diagnosis present

## 2018-06-27 LAB — GLUCOSE, CAPILLARY
GLUCOSE-CAPILLARY: 85 mg/dL (ref 70–99)
GLUCOSE-CAPILLARY: 104 mg/dL — AB (ref 70–99)
Glucose-Capillary: 94 mg/dL (ref 70–99)
Glucose-Capillary: 120 mg/dL — ABNORMAL HIGH (ref 70–99)

## 2018-06-27 LAB — TYPE AND SCREEN
ABO/RH(D): A POS
Antibody Screen: NEGATIVE

## 2018-06-27 LAB — ABO/RH: ABO/RH(D): A POS

## 2018-06-27 LAB — CBC
HEMATOCRIT: 33.4 % — AB (ref 36.0–46.0)
HEMOGLOBIN: 11.2 g/dL — AB (ref 12.0–15.0)
MCH: 26.9 pg (ref 26.0–34.0)
MCHC: 33.5 g/dL (ref 30.0–36.0)
MCV: 80.1 fL (ref 78.0–100.0)
Platelets: 186 10*3/uL (ref 150–400)
RBC: 4.17 MIL/uL (ref 3.87–5.11)
RDW: 15.8 % — ABNORMAL HIGH (ref 11.5–15.5)
WBC: 8.6 10*3/uL (ref 4.0–10.5)

## 2018-06-27 MED ORDER — ONDANSETRON HCL 4 MG/2ML IJ SOLN
4.0000 mg | Freq: Four times a day (QID) | INTRAMUSCULAR | Status: DC | PRN
  Filled 2018-06-27: qty 2

## 2018-06-27 MED ORDER — FLEET ENEMA 7-19 GM/118ML RE ENEM: 1.0000 | ENEMA | RECTAL | Status: DC | PRN

## 2018-06-27 MED ORDER — MISOPROSTOL 50MCG HALF TABLET
50.0000 ug | ORAL_TABLET | ORAL | Status: DC | PRN
  Administered 2018-06-27 (×2): 50 ug via BUCCAL
  Filled 2018-06-27 (×2): qty 1

## 2018-06-27 MED ORDER — LACTATED RINGERS IV SOLN
INTRAVENOUS | Status: DC
  Administered 2018-06-27 – 2018-06-28 (×3): via INTRAVENOUS

## 2018-06-27 MED ORDER — OXYTOCIN BOLUS FROM INFUSION
500.0000 mL | Freq: Once | INTRAVENOUS | Status: AC
  Administered 2018-06-28: 500 mL via INTRAVENOUS

## 2018-06-27 MED ORDER — FENTANYL CITRATE (PF) 100 MCG/2ML IJ SOLN
100.0000 ug | INTRAMUSCULAR | Status: DC | PRN
  Administered 2018-06-27 – 2018-06-28 (×4): 100 ug via INTRAVENOUS
  Filled 2018-06-27 (×4): qty 2

## 2018-06-27 MED ORDER — MISOPROSTOL 25 MCG QUARTER TABLET
25.0000 ug | ORAL_TABLET | ORAL | Status: DC | PRN
  Administered 2018-06-27: 25 ug via VAGINAL
  Filled 2018-06-27: qty 1

## 2018-06-27 MED ORDER — OXYTOCIN 40 UNITS IN LACTATED RINGERS INFUSION - SIMPLE MED: 2.5000 [IU]/h | INTRAVENOUS | Status: DC

## 2018-06-27 MED ORDER — LIDOCAINE HCL (PF) 1 % IJ SOLN
30.0000 mL | INTRAMUSCULAR | Status: DC | PRN
  Filled 2018-06-27: qty 30

## 2018-06-27 MED ORDER — TERBUTALINE SULFATE 1 MG/ML IJ SOLN: 0.2500 mg | Freq: Once | INTRAMUSCULAR | Status: DC | PRN

## 2018-06-27 MED ORDER — SOD CITRATE-CITRIC ACID 500-334 MG/5ML PO SOLN: 30.0000 mL | ORAL | Status: DC | PRN

## 2018-06-27 MED ORDER — LACTATED RINGERS IV SOLN: 500.0000 mL | INTRAVENOUS | Status: DC | PRN

## 2018-06-27 NOTE — H&P (Addendum)
OBSTETRIC ADMISSION HISTORY AND PHYSICAL  Alyssa Davidson is a 32 y.o. female G1P0 with IUP at 30w1dby L/7 presenting for IOL for GDM. She reports +FMs, No LOF, no VB, no blurry vision, headaches or peripheral edema, and RUQ pain.  She plans on breast feeding. She would like to discuss birth control further with her outpatient provider. She received her prenatal care at [facility]   Dating: By L/7 --->  Estimated Date of Delivery: 07/03/18  Sono:  06/11/2018 _0 , normal anatomy, cephalic presentation, [id number] >90% EFW   Prenatal History/Complications: LGA EASN>05%GDM Sickle cell trait  Past Medical History: Past Medical History:  Diagnosis Date  . Gestational diabetes   . Lipoma    right axilla    Past Surgical History: Past Surgical History:  Procedure Laterality Date  . NO PAST SURGERIES      Obstetrical History: OB History    Gravida  1   Para      Term      Preterm      AB      Living        SAB      TAB      Ectopic      Multiple      Live Births              Social History: Social History   Socioeconomic History  . Marital status: Single    Spouse name: Not on file  . Number of children: Not on file  . Years of education: Not on file  . Highest education level: Not on file  Occupational History  . Not on file  Social Needs  . Financial resource strain: Not on file  . Food insecurity:    Worry: Not on file    Inability: Not on file  . Transportation needs:    Medical: Not on file    Non-medical: Not on file  Tobacco Use  . Smoking status: Never Smoker  . Smokeless tobacco: Never Used  Substance and Sexual Activity  . Alcohol use: No    Frequency: Never  . Drug use: No  . Sexual activity: Yes  Lifestyle  . Physical activity:    Days per week: Not on file    Minutes per session: Not on file  . Stress: Not on file  Relationships  . Social connections:    Talks on phone: Not on file    Gets together: Not on file   Attends religious service: Not on file    Active member of club or organization: Not on file    Attends meetings of clubs or organizations: Not on file    Relationship status: Not on file  Other Topics Concern  . Not on file  Social History Narrative  . Not on file    Family History: Family History  Problem Relation Age of Onset  . Healthy Mother   . Hypertension Father   . Diabetes Father   . Healthy Sister   . Healthy Brother     Allergies: No Known Allergies  Medications Prior to Admission  Medication Sig Dispense Refill Last Dose  . blood glucose meter kit and supplies KIT Dispense based on patient and insurance preference. Use up to four times daily as directed. (FOR ICD-9 250.00, 250.01).  ACCUCHECK ONE TOUCH 1 each 12 Taking  . famotidine (PEPCID) 40 MG tablet Take 1 tablet (40 mg total) by mouth daily. 30 tablet 6 Taking  . glucose blood test strip  Use as instructed 100 each 12 Taking  . Prenatal MV-Min-FA-Omega-3 (PRENATAL GUMMIES/DHA & FA PO) Take by mouth.   Taking     Review of Systems   All systems reviewed and negative except as stated in HPI  Blood pressure 115/74, pulse (!) 106, temperature 98.8 F (37.1 C), temperature source Oral, resp. rate 18, weight 105 kg, last menstrual period 09/29/2017. General appearance: alert, cooperative, appears stated age, no distress and moderately obese Lungs: clear to auscultation bilaterally Heart: regular rate and rhythm Abdomen: soft, non-tender; bowel sounds normal Pelvic: see below Extremities: Homans sign is negative, no sign of DVT  Fetal monitoringBaseline: 150 bpm, Variability: Good {> 6 bpm), Accelerations: Reactive and Decelerations: Absent Uterine activityNone Dilation: Closed Effacement (%): Thick Station: Ballotable Exam by:: Krystal Eaton, RN   Prenatal labs: ABO, Rh: A/Positive/-- (03/08 1502) Antibody: Negative (03/08 1502) Rubella: 15.50 (03/08 1502) RPR: Non Reactive (06/26 0821)  HBsAg:  Negative (03/08 1502)  HIV: Non Reactive (06/26 0821)  GBS:    2 hr GTT: 94/162/187 - elevated Genetic screening  declined Anatomy US , normal female, incomplete survey, never completed.  Prenatal Transfer Tool  Maternal Diabetes: Yes:  Diabetes Type:  Diet controlled Genetic Screening: Declined Maternal Ultrasounds/Referrals: Normal Fetal Ultrasounds or other Referrals:  None Maternal Substance Abuse:  No Significant Maternal Medications:  None Significant Maternal Lab Results: None  Results for orders placed or performed during the hospital encounter of 06/27/18 (from the past 24 hour(s))  Glucose, capillary   Collection Time: 06/27/18 10:45 AM  Result Value Ref Range   Glucose-Capillary 85 70 - 99 mg/dL    Patient Active Problem List   Diagnosis Date Noted  . Gestational diabetes 06/27/2018  . LGA (large for gestational age) fetus affecting management of mother 06/11/2018  . Gestational diabetes mellitus (GDM) affecting first pregnancy 04/16/2018  . Sickle cell trait (Alyssa Davidson) 12/27/2017  . Supervision of high risk pregnancy, antepartum 12/21/2017    Assessment/Plan:  Alyssa Davidson is a 32 y.o. G1P0 at 72w1dhere for IOL for A1GDM  #Labor: starting induction with cytotec, will attempt FB when cervix begins to dilate #Pain: Would like to avoid epidural, open to IV meds and nitric oxide #FWB: Category I #ID: GBS neg #MOF: breast #MOC: would like to discuss with outpatient provider #Circ:  Yes, here #A1GDM: monitor CBG Q4 hrs in latent phase and Q2 in active  Alyssa Haymaker MD  06/27/2018, 10:54 AM   I confirm that I have verified the information documented in the resident's note and that I have also personally reperformed the physical exam and all medical decision making activities.  Alyssa Davidson CNM 06/27/18 12:50 PM

## 2018-06-27 NOTE — Progress Notes (Signed)
Labor Progress Note Alyssa Davidson is a 32 y.o. G1P0 at [redacted]w[redacted]d presented for IOL for GDM.  S: Pt is resting comfortably. Denies any pain or feeling contractions.   O:  BP (!) 135/92   Pulse (!) 104   Temp 97.8 F (36.6 C) (Oral)   Resp 16   Ht 5' 5.98" (1.676 m)   Wt 105 kg   LMP 09/29/2017 (Approximate)   BMI 37.38 kg/m  EFM: 145 bpm/moderate variability/accels: 15x15; decels: none  CVE: Dilation: 1 Effacement (%): Thick Cervical Position: Posterior Station: -3 Presentation: Vertex Exam by:: Verdene Lennert CNM   A&P: 32 y.o. G1P0 [redacted]w[redacted]d presented for IOL for GDM  #Labor: Minor progression w/ oral cytotec x 2, administer cytotec 25 mg intravaginally and monitor. Plan for IUFB.  #Pain: none currently, plan for epidural PRN.  #FWB: Cat 1  #GBS: negative #GDM: BG 120 @1848 . Continue to monitor.   [name], [place] 9:56 PM

## 2018-06-27 NOTE — Anesthesia Pain Management Evaluation Note (Signed)
  CRNA Pain Management Visit Note  Patient: Alyssa Davidson, 32 y.o., female  "Hello I am a member of the anesthesia team at [facility]. We have an anesthesia team available at all times to provide care throughout the hospital, including epidural management and anesthesia for C-section. I don't know your plan for the delivery whether it a natural birth, water birth, IV sedation, nitrous supplementation, doula or epidural, but we want to meet your pain goals."   1.Was your pain managed to your expectations on prior hospitalizations?   No prior hospitalizations  2.What is your expectation for pain management during this hospitalization?     Labor support without medications, Epidural and IV pain meds  3.How can we help you reach that goal? *support**  Record the patient's initial score and the patient's pain goal.   Pain: 2  Pain Goal: 8 [facility] wants you to be able to say your pain was always managed very well.  Sandrea Matte 06/27/2018

## 2018-06-28 ENCOUNTER — Encounter (HOSPITAL_COMMUNITY): Payer: Self-pay

## 2018-06-28 ENCOUNTER — Inpatient Hospital Stay (HOSPITAL_COMMUNITY): Payer: 59 | Admitting: Anesthesiology

## 2018-06-28 DIAGNOSIS — O134 Gestational [pregnancy-induced] hypertension without significant proteinuria, complicating childbirth: Secondary | ICD-10-CM

## 2018-06-28 DIAGNOSIS — Z3A39 39 weeks gestation of pregnancy: Secondary | ICD-10-CM

## 2018-06-28 LAB — GLUCOSE, CAPILLARY
Glucose-Capillary: 75 mg/dL (ref 70–99)
Glucose-Capillary: 88 mg/dL (ref 70–99)

## 2018-06-28 LAB — RPR: RPR Ser Ql: NONREACTIVE

## 2018-06-28 MED ORDER — IBUPROFEN 600 MG PO TABS
600.0000 mg | ORAL_TABLET | Freq: Four times a day (QID) | ORAL | Status: DC
  Administered 2018-06-28 – 2018-06-30 (×9): 600 mg via ORAL
  Filled 2018-06-28 (×9): qty 1

## 2018-06-28 MED ORDER — PHENYLEPHRINE 40 MCG/ML (10ML) SYRINGE FOR IV PUSH (FOR BLOOD PRESSURE SUPPORT)
80.0000 ug | PREFILLED_SYRINGE | INTRAVENOUS | Status: DC | PRN
  Administered 2018-06-28: 80 ug via INTRAVENOUS

## 2018-06-28 MED ORDER — SIMETHICONE 80 MG PO CHEW: 80.0000 mg | CHEWABLE_TABLET | ORAL | Status: DC | PRN

## 2018-06-28 MED ORDER — LIDOCAINE HCL (PF) 1 % IJ SOLN
INTRAMUSCULAR | Status: DC | PRN
  Administered 2018-06-28 (×2): 4 mL via EPIDURAL

## 2018-06-28 MED ORDER — DIPHENHYDRAMINE HCL 50 MG/ML IJ SOLN: 12.5000 mg | INTRAMUSCULAR | Status: DC | PRN

## 2018-06-28 MED ORDER — WITCH HAZEL-GLYCERIN EX PADS: 1.0000 "application " | MEDICATED_PAD | CUTANEOUS | Status: DC | PRN

## 2018-06-28 MED ORDER — SODIUM CHLORIDE 0.9 % IV SOLN
2.0000 g | Freq: Four times a day (QID) | INTRAVENOUS | Status: DC
  Administered 2018-06-28: 2 g via INTRAVENOUS
  Filled 2018-06-28: qty 2
  Filled 2018-06-28 (×2): qty 2000

## 2018-06-28 MED ORDER — OXYCODONE HCL 5 MG PO TABS: 5.0000 mg | ORAL_TABLET | ORAL | Status: DC | PRN

## 2018-06-28 MED ORDER — GENTAMICIN SULFATE 40 MG/ML IJ SOLN
5.0000 mg/kg/d | Freq: Every day | INTRAVENOUS | Status: DC
  Administered 2018-06-28: 390 mg via INTRAVENOUS
  Filled 2018-06-28: qty 9.75

## 2018-06-28 MED ORDER — BENZOCAINE-MENTHOL 20-0.5 % EX AERO
1.0000 "application " | INHALATION_SPRAY | CUTANEOUS | Status: DC | PRN
  Administered 2018-06-29: 1 via TOPICAL
  Filled 2018-06-28 (×2): qty 56

## 2018-06-28 MED ORDER — EPHEDRINE 5 MG/ML INJ: 10.0000 mg | INTRAVENOUS | Status: DC | PRN

## 2018-06-28 MED ORDER — TETANUS-DIPHTH-ACELL PERTUSSIS 5-2.5-18.5 LF-MCG/0.5 IM SUSP
0.5000 mL | Freq: Once | INTRAMUSCULAR | Status: DC
  Filled 2018-06-28: qty 0.5

## 2018-06-28 MED ORDER — LACTATED RINGERS IV SOLN: 500.0000 mL | Freq: Once | INTRAVENOUS | Status: DC

## 2018-06-28 MED ORDER — DIBUCAINE 1 % RE OINT
1.0000 "application " | TOPICAL_OINTMENT | RECTAL | Status: DC | PRN
  Filled 2018-06-28: qty 28

## 2018-06-28 MED ORDER — ONDANSETRON HCL 4 MG PO TABS: 4.0000 mg | ORAL_TABLET | ORAL | Status: DC | PRN

## 2018-06-28 MED ORDER — DIPHENHYDRAMINE HCL 25 MG PO CAPS: 25.0000 mg | ORAL_CAPSULE | Freq: Four times a day (QID) | ORAL | Status: DC | PRN

## 2018-06-28 MED ORDER — SENNOSIDES-DOCUSATE SODIUM 8.6-50 MG PO TABS
2.0000 | ORAL_TABLET | ORAL | Status: DC
  Administered 2018-06-28 – 2018-06-29 (×2): 2 via ORAL
  Filled 2018-06-28 (×2): qty 2

## 2018-06-28 MED ORDER — ACETAMINOPHEN 325 MG PO TABS
650.0000 mg | ORAL_TABLET | Freq: Four times a day (QID) | ORAL | Status: DC | PRN
  Administered 2018-06-28: 650 mg via ORAL
  Filled 2018-06-28: qty 2

## 2018-06-28 MED ORDER — ACETAMINOPHEN 325 MG PO TABS: 650.0000 mg | ORAL_TABLET | ORAL | Status: DC | PRN

## 2018-06-28 MED ORDER — OXYTOCIN 40 UNITS IN LACTATED RINGERS INFUSION - SIMPLE MED
1.0000 m[IU]/min | INTRAVENOUS | Status: DC
  Administered 2018-06-28: 2 m[IU]/min via INTRAVENOUS
  Filled 2018-06-28: qty 1000

## 2018-06-28 MED ORDER — COCONUT OIL OIL
1.0000 "application " | TOPICAL_OIL | Status: DC | PRN
  Filled 2018-06-28 (×2): qty 120

## 2018-06-28 MED ORDER — PHENYLEPHRINE 40 MCG/ML (10ML) SYRINGE FOR IV PUSH (FOR BLOOD PRESSURE SUPPORT)
80.0000 ug | PREFILLED_SYRINGE | INTRAVENOUS | Status: DC | PRN
  Filled 2018-06-28: qty 10

## 2018-06-28 MED ORDER — ZOLPIDEM TARTRATE 5 MG PO TABS: 5.0000 mg | ORAL_TABLET | Freq: Every evening | ORAL | Status: DC | PRN

## 2018-06-28 MED ORDER — TERBUTALINE SULFATE 1 MG/ML IJ SOLN: 0.2500 mg | Freq: Once | INTRAMUSCULAR | Status: DC | PRN

## 2018-06-28 MED ORDER — ONDANSETRON HCL 4 MG/2ML IJ SOLN: 4.0000 mg | INTRAMUSCULAR | Status: DC | PRN

## 2018-06-28 MED ORDER — FENTANYL 2.5 MCG/ML BUPIVACAINE 1/10 % EPIDURAL INFUSION (WH - ANES)
14.0000 mL/h | INTRAMUSCULAR | Status: DC | PRN
  Administered 2018-06-28: 14 mL/h via EPIDURAL
  Filled 2018-06-28: qty 100

## 2018-06-28 MED ORDER — PRENATAL MULTIVITAMIN CH
1.0000 | ORAL_TABLET | Freq: Every day | ORAL | Status: DC
  Administered 2018-06-29 – 2018-06-30 (×2): 1 via ORAL
  Filled 2018-06-28 (×2): qty 1

## 2018-06-28 NOTE — Progress Notes (Signed)
Patient doing well, breathing through contractions- requesting IV pain medication at this time   06/28/18 0109  98.1 F (36.7 C)  92  -  18  111/62  -  -  -  - SB   06/27/18 2321  -  94  -  16  103/59Abnormal   -  -  -  - SB   06/27/18 2112  98 F (36.7 C)  98  -  18  114/79  -  -  -  - SB    FHR: 135/ moderate variability/ +accels/ no decels  Toco: 2-3/ mild- moderate by palpation   SVE: 4cm/50/-3 FB out at 0100.   Labor: Pitocin ordered for IOL, FB out  FWB: Cat I  Plans SVD  Lajean Manes, CNM 06/28/18

## 2018-06-28 NOTE — Progress Notes (Signed)
LABOR PROGRESS NOTE  Alyssa Davidson is a 32 y.o. G1P0 at [redacted]w[redacted]d  admitted for IOL for GDM   Subjective: Patient doing well, comfortable with epidural   Objective: BP 107/65   Pulse 93   Temp 98.9 F (37.2 C)   Resp 18   Ht 5' 5.98" (1.676 m)   Wt 105 kg   LMP 09/29/2017 (Approximate)   SpO2 99%   BMI 37.38 kg/m  or  Vitals:   06/28/18 0651 06/28/18 0656 06/28/18 0701 06/28/18 0720  BP: (!) 95/59 106/63 (!) 101/54 107/65  Pulse: 96 97 82 93  Resp:      Temp:      TempSrc:      SpO2: 96% 95% 99%   Weight:      Height:       CBG: 75 and 88 this morning   SROM @0655  - clear fluid  Dilation: 5.5 Effacement (%): 60 Cervical Position: Posterior Station: -2 Presentation: Vertex Exam by:: [employer] RN FHT: baseline rate 140, moderate varibility, +acel, early decel Toco: 1-4   Labs: Lab Results  Component Value Date   WBC 8.6 06/27/2018   HGB 11.2 (L) 06/27/2018   HCT 33.4 (L) 06/27/2018   MCV 80.1 06/27/2018   PLT 186 06/27/2018    Patient Active Problem List   Diagnosis Date Noted  . Gestational diabetes 06/27/2018  . LGA (large for gestational age) fetus affecting management of mother 06/11/2018  . Gestational diabetes mellitus (GDM) affecting first pregnancy 04/16/2018  . Sickle cell trait ([place]) 12/27/2017  . Supervision of high risk pregnancy, antepartum 12/21/2017    Assessment / Plan: 32 y.o. G1P0 at [redacted]w[redacted]d here for IOL for GDM   Labor: Progressing well on pitocin, continue titration to active labor  Fetal Wellbeing:  Cat I  Pain Control:  Epidural  Anticipated MOD:  SVD  Lajean Manes, CNM 06/28/2018, 7:20 AM

## 2018-06-28 NOTE — Anesthesia Preprocedure Evaluation (Signed)
Anesthesia Evaluation  Patient identified by MRN, date of birth, ID band Patient awake    Reviewed: Allergy & Precautions, NPO status , Patient's Chart, lab work & pertinent test results  Airway Mallampati: II  TM Distance: >3 FB Neck ROM: Full    Dental no notable dental hx.    Pulmonary neg pulmonary ROS,    Pulmonary exam normal breath sounds clear to auscultation       Cardiovascular negative cardio ROS Normal cardiovascular exam Rhythm:Regular Rate:Normal     Neuro/Psych negative neurological ROS  negative psych ROS   GI/Hepatic negative GI ROS, Neg liver ROS,   Endo/Other  diabetes  Renal/GU negative Renal ROS     Musculoskeletal negative musculoskeletal ROS (+)   Abdominal   Peds  Hematology negative hematology ROS (+)   Anesthesia Other Findings   Reproductive/Obstetrics (+) Pregnancy                             Anesthesia Physical Anesthesia Plan  ASA: II  Anesthesia Plan: Epidural   Post-op Pain Management:    Induction:   PONV Risk Score and Plan:   Airway Management Planned:   Additional Equipment:   Intra-op Plan:   Post-operative Plan:   Informed Consent: I have reviewed the patients History and Physical, chart, labs and discussed the procedure including the risks, benefits and alternatives for the proposed anesthesia with the patient or authorized representative who has indicated his/her understanding and acceptance.     Plan Discussed with:   Anesthesia Plan Comments:         Anesthesia Quick Evaluation

## 2018-06-28 NOTE — Lactation Note (Signed)
This note was copied from a baby's chart. Lactation Consultation Note Baby 1 hrs old. Baby sleepy. Discussed newborn behavior, STS, I&O, cluster feeding, supply and demand. Mom encouraged to feed baby 8-12 times/24 hours and with feeding cues.  Mom wants to pump for stimulation to get her milk to come in faster. Mom has her personal DEBP Medela. Explained normal not to collect anything.  Mom knows to pump q3h for 15-20 min.   Mom has large breast, short shaft everted nipples. Everts more w/stimulation. Mom has hand pump, encouraged to pre-pump prior to latching. Shells given, mom putting bra on and applying shells at this time.  Hand expression taught w/no colostrum noted at this time. Mom has generalized edema. Encouraged to wake baby is hasn't cued in 3 hrs, [name] brochure given w/resources, support groups and [place] services.  Patient Name: Alyssa Davidson [id number] Date: 06/28/2018 Reason for consult: Initial assessment;1st time breastfeeding   Maternal Data Has patient been taught Hand Expression?: Yes Does the patient have breastfeeding experience prior to this delivery?: No  Feeding Feeding Type: Breast Fed Length of feed: 15 min  LATCH Score       Type of Nipple: Everted at rest and after stimulation(short shaft)  Comfort (Breast/Nipple): Soft / non-tender        Interventions Interventions: Breast feeding basics reviewed;Position options;Breast massage;Hand express;Shells;DEBP;Breast compression  Lactation Tools Discussed/Used Tools: Pump Nipple shield size: 20 Breast pump type: Double-Electric Breast Pump   Consult Status Consult Status: Follow-up Date: 06/29/18 Follow-up type: In-patient    Theodoro Kalata 06/28/2018, 8:12 PM

## 2018-06-28 NOTE — Anesthesia Postprocedure Evaluation (Signed)
Anesthesia Post Note  Patient: Alyssa Davidson  Procedure(s) Performed: AN AD [place]     Patient location during evaluation: Mother Baby Anesthesia Type: Epidural Level of consciousness: awake and alert Pain management: pain level controlled Vital Signs Assessment: post-procedure vital signs reviewed and stable Respiratory status: spontaneous breathing, nonlabored ventilation and respiratory function stable Cardiovascular status: stable Postop Assessment: no headache, no backache, epidural receding, able to ambulate, adequate PO intake, no apparent nausea or vomiting and patient able to bend at knees Anesthetic complications: no    Last Vitals:  Vitals:   06/28/18 1420 06/28/18 1800  BP: 111/65 114/63  Pulse: 96 94  Resp: 18 18  Temp: 37.2 C 36.8 C  SpO2: 100%     Last Pain:  Vitals:   06/28/18 1800  TempSrc: Oral  PainSc: 0-No pain   Pain Goal:                 [employer]

## 2018-06-29 NOTE — Progress Notes (Signed)
Post Partum Day #1 Subjective: no complaints, up ad lib and tolerating PO; breastfeeding going well; will decided on contraception at [facility] visit; would like infant to be circumcised  Objective: Blood pressure (!) 100/52, pulse 79, temperature 98 F (36.7 C), temperature source Oral, resp. rate 18, height 5' 5.98" (1.676 m), weight 105 kg, last menstrual period 09/29/2017, SpO2 100 %, unknown if currently breastfeeding.  Physical Exam:  General: alert, cooperative and no distress Lochia: appropriate Uterine Fundus: firm DVT Evaluation: No evidence of DVT seen on physical exam.  Recent Labs    06/27/18 1045  HGB 11.2*  HCT 33.4*    Assessment/Plan: Plan for discharge tomorrow and Circumcision prior to discharge   LOS: 2 days   Serita Grammes CNM 06/29/2018, 7:32 AM

## 2018-06-29 NOTE — Plan of Care (Signed)
Progressing appropriately. Encouraged to call for assistance as needed, and for [facility] assessment. Parent request formula to supplement breast feeding due worry about baby not eating. Parents have been informed of small tummy size of newborn, taught hand expression and understand the possible consequences of formula to the health of the infant. The possible consequences shared with patient include 1) Loss of confidence in breastfeeding 2) Engorgement 3) Allergic sensitization of baby(asthma/allergies) and 4) decreased milk supply for mother.After discussion of the above the mother decided to not supplement at this time.  Mother counseled to avoid artificial nipples because this practice may lead to latch difficulties,inadequate milk transfer and nipple soreness.

## 2018-06-30 MED ORDER — IBUPROFEN 600 MG PO TABS: 600.0000 mg | ORAL_TABLET | Freq: Four times a day (QID) | ORAL | 0 refills | Status: AC

## 2018-06-30 NOTE — Discharge Summary (Signed)
Postpartum Discharge Summary     Patient Name: Alyssa Davidson DOB: 05/11/1986 MRN: [id number]  Date of admission: 06/27/2018 Delivering Provider: Matilde Haymaker   Date of discharge: 06/30/2018  Admitting diagnosis: 3WKS INDUCTION  Intrauterine pregnancy: [redacted]w[redacted]d    Secondary diagnosis:  Active Problems:   Gestational diabetes  Additional problems: LGA, [place] trait     Discharge diagnosis: Term Pregnancy Delivered and GDM A1                                                                                                Post partum procedures:none  Augmentation: Pitocin and Cytotec  Complications: Intrauterine Inflammation or infection ([place])  Hospital course:  Induction of Labor With Vaginal Delivery   32y.o. yo G1P1001 at 383w2das admitted to the hospital 06/27/2018 for induction of labor.  Indication for induction: A1 DM.  Patient had an uncomplicated labor course as follows: Membrane Rupture Time/Date: 6:55 AM ,06/28/2018   Intrapartum Procedures: Episiotomy: None [1]                                         Lacerations:  2nd degree [3];Perineal [11]  Patient had delivery of a Viable infant.  Information for the patient's newborn:  OlKourtney, Terriquez0[[id number]]Delivery Method: Vag-Spont   06/28/2018  Details of delivery can be found in separate delivery note.  Patient had a routine postpartum course. Patient is discharged home 06/30/18.  Magnesium Sulfate recieved: No BMZ received: No  Physical exam  Vitals:   06/29/18 0603 06/29/18 1443 06/29/18 2107 06/30/18 0500  BP: (!) 100/52 120/76 94/65 115/67  Pulse: 79 84 92 86  Resp: '18  18 20  ' Temp: 98 F (36.7 C) 98.4 F (36.9 C) 98.1 F (36.7 C) 97.9 F (36.6 C)  TempSrc: Oral Oral Oral Oral  SpO2:  100%    Weight:      Height:       General: alert, cooperative and no distress Lochia: appropriate Uterine Fundus: firm Incision: Healing well with no significant drainage, No significant erythema DVT  Evaluation: No evidence of DVT seen on physical exam. Negative Homan's sign. No cords or calf tenderness. No significant calf/ankle edema. Labs: Lab Results  Component Value Date   WBC 8.6 06/27/2018   HGB 11.2 (L) 06/27/2018   HCT 33.4 (L) 06/27/2018   MCV 80.1 06/27/2018   PLT 186 06/27/2018   No flowsheet data found.  Discharge instruction: per After Visit Summary and "Baby and Me Booklet".  After visit meds:  Allergies as of 06/30/2018   No Known Allergies     Medication List    STOP taking these medications   blood glucose meter kit and supplies Kit   famotidine 40 MG tablet Commonly known as:  PEPCID   glucose blood test strip     TAKE these medications   ibuprofen 600 MG tablet Commonly known as:  ADVIL,MOTRIN Take 1 tablet (600 mg total) by mouth every 6 (six) hours.  PRENATAL GUMMIES/DHA & FA PO Take 2 each by mouth daily.       Diet: routine diet  Activity: Advance as tolerated. Pelvic rest for 6 weeks.   Outpatient follow up:6 weeks Follow up Appt: Future Appointments  Date Time Provider [place]  08/01/2018  8:30 AM [facility] None  08/01/2018  9:50 AM Laury Deep, CNM [facility] None   Follow up Visit:No follow-ups on file.   Please schedule this patient for Postpartum visit in: 6 weeks with the following provider: Any provider For C/S patients schedule nurse incision check in weeks 2 weeks: no High risk pregnancy complicated by: GDM Delivery mode:  SVD Anticipated Birth Control:  other/unsure PP Procedures needed: 2 hour GTT  Schedule Integrated BH visit: no      Newborn Data: Live born female  Birth Weight: 8 lb 4.6 oz (3760 g) APGAR: 4, 9  Newborn Delivery   Birth date/time:  06/28/2018 10:52:00 Delivery type:  Vaginal, Spontaneous     Baby Feeding: Breast Disposition:home with mother   06/30/2018 Julianne Handler, CNM

## 2018-06-30 NOTE — Lactation Note (Signed)
This note was copied from a baby's chart. Lactation Consultation Note  Patient Name: Alyssa Davidson [id number] Date: 06/30/2018 Reason for consult: Follow-up assessment;Term  P1 mother whose infant is now 1 hours old  Mother has been breast feeding, pumping with the DEBP and supplementing with formula.  She wants to be sure baby "is getting enough."  I reviewed supply and demand, feeding cues, latching including how to obtain a deep latch, breast compressions, hand expression, pumping more frequently after feedings if she would like to supplement with her EBM instead of formula, voiding/stooling patterns.    With supplementation parents choose to use the 5 FR syringe with formula.  Reviewed cleaning the tools and father has been doing this correctly.  Mother has a DEBP for home use.  RN provided coconut oil per mother's request.  Parents had many questions which I answered to their satisfaction.  Reminded parents of our OP number and services if mother would like to return for a visit.  They have private insurance and encouraged them to call to see about financial obligations with OP visits.  Mother will call our number for questions after discharge.   Maternal Data Formula Feeding for Exclusion: No Has patient been taught Hand Expression?: Yes Does the patient have breastfeeding experience prior to this delivery?: No  Feeding Feeding Type: Breast Fed Length of feed: 10 min  LATCH Score                   Interventions    Lactation Tools Discussed/Used WIC Program: No   Consult Status Consult Status: Complete Date: 06/30/18 Follow-up type: Call as needed    Chiron Campione R Modean Mccullum 06/30/2018, 11:56 AM

## 2018-08-01 ENCOUNTER — Other Ambulatory Visit: Payer: 59 | Admitting: *Deleted

## 2018-08-01 ENCOUNTER — Encounter: Payer: Self-pay | Admitting: Obstetrics and Gynecology

## 2018-08-01 ENCOUNTER — Other Ambulatory Visit: Payer: Self-pay

## 2018-08-01 ENCOUNTER — Ambulatory Visit (INDEPENDENT_AMBULATORY_CARE_PROVIDER_SITE_OTHER): Payer: 59 | Admitting: Obstetrics and Gynecology

## 2018-08-01 ENCOUNTER — Ambulatory Visit: Payer: 59 | Admitting: Obstetrics and Gynecology

## 2018-08-01 VITALS — BP 133/92 | HR 93 | Temp 98.1°F | Ht 66.0 in | Wt 211.0 lb

## 2018-08-01 DIAGNOSIS — O24439 Gestational diabetes mellitus in the puerperium, unspecified control: Secondary | ICD-10-CM

## 2018-08-01 DIAGNOSIS — Z1389 Encounter for screening for other disorder: Secondary | ICD-10-CM | POA: Diagnosis not present

## 2018-08-01 NOTE — Progress Notes (Signed)
   Post Partum Exam  Alyssa Davidson is a 32 y.o. G28P1001 female who presents for a postpartum visit. She is 5 weeks postpartum following a spontaneous vaginal delivery. I have fully reviewed the prenatal and intrapartum course. The delivery was at 55 gestational weeks.  Anesthesia: none. Postpartum course has been uncomplicated. She moved to [place], [place] after having the baby. Baby's course has been uncomplicated. Baby is feeding by breast and bottle (of expressed breast milk). Bleeding no bleeding. Bowel function is normal. Bladder function is normal. Patient is not sexually active. Contraception method is undecided, but considering Liletta or Nexplanon. Postpartum depression screening: negative.  The following portions of the patient's history were reviewed and updated as appropriate: allergies, current medications, past family history, past medical history, past social history, past surgical history and problem list. Last pap smear done 12/2017 and was Normal  Review of Systems Constitutional: negative Eyes: negative Ears, nose, mouth, throat, and face: negative Respiratory: negative Cardiovascular: negative Gastrointestinal: negative Genitourinary:negative Integument/breast: negative Hematologic/lymphatic: negative Musculoskeletal:negative Neurological: negative Behavioral/Psych: negative Endocrine: negative Allergic/Immunologic: negative    Objective:  Blood pressure (!) 133/92, pulse 93, temperature 98.1 F (36.7 C), temperature source Oral, height 5\' 6"  (1.676 m), weight 211 lb (95.7 kg), currently breastfeeding.  General:  alert, cooperative and no distress   Breasts:  inspection negative, no nipple discharge or bleeding, no masses or nodularity palpable  Lungs: clear to auscultation bilaterally  Heart:  regular rate and rhythm, S1, S2 normal, no murmur, click, rub or gallop  Abdomen: soft, non-tender; bowel sounds normal; no masses,  no organomegaly   Vulva:  not evaluated    Vagina: not evaluated  Cervix:  not examined  Corpus: not examined  Adnexa:  not evaluated  Rectal Exam: Not performed.        Assessment:    Normal postpartum exam. Pap smear not done at today's visit.   Plan:   1. Contraception: IUD and Nexplanon - will 08/23/2018. 2. Follow up in: 3 weeks or as needed.

## 2018-08-01 NOTE — Progress Notes (Signed)
   Patient in clinic for her postpartum glucose testing.  Derl Barrow, RN

## 2018-08-02 LAB — GLUCOSE TOLERANCE, 2 HOURS W/ 1HR
GLUCOSE, FASTING: 76 mg/dL (ref 65–91)
Glucose, 1 hour: 131 mg/dL (ref 65–179)
Glucose, 2 hour: 119 mg/dL (ref 65–152)

## 2018-08-23 ENCOUNTER — Encounter: Payer: Self-pay | Admitting: Obstetrics and Gynecology

## 2018-08-23 ENCOUNTER — Encounter: Payer: Self-pay | Admitting: General Practice

## 2018-08-23 ENCOUNTER — Ambulatory Visit (INDEPENDENT_AMBULATORY_CARE_PROVIDER_SITE_OTHER): Payer: 59 | Admitting: Obstetrics and Gynecology

## 2018-08-23 VITALS — BP 129/89 | HR 84 | Resp 16 | Ht 66.0 in | Wt 211.2 lb

## 2018-08-23 DIAGNOSIS — Z3202 Encounter for pregnancy test, result negative: Secondary | ICD-10-CM | POA: Diagnosis not present

## 2018-08-23 DIAGNOSIS — Z30017 Encounter for initial prescription of implantable subdermal contraceptive: Secondary | ICD-10-CM

## 2018-08-23 DIAGNOSIS — Z01818 Encounter for other preprocedural examination: Secondary | ICD-10-CM

## 2018-08-23 LAB — POCT URINE PREGNANCY: Preg Test, Ur: NEGATIVE

## 2018-08-23 MED ORDER — ETONOGESTREL 68 MG ~~LOC~~ IMPL
68.0000 mg | DRUG_IMPLANT | Freq: Once | SUBCUTANEOUS | Status: AC
  Administered 2018-08-23: 68 mg via SUBCUTANEOUS

## 2018-08-23 NOTE — Progress Notes (Signed)
[name] is a 32 y.o. Postpartum patient for Nexplanon insertion today. She has complaints of bilateral wrist pain since after delivery. Patient given informed consent, she signed consent form. Pregnancy test was negative. Appropriate time out taken.  Patient's left arm was prepped and draped in the usual sterile fashion.. The ruler used to measure and mark insertion area.  Patient was prepped with alcohol swab and then injected with 5 ml of 1 % lidocaine.  She was prepped with betadine, Nexplanon removed from packaging,  Device confirmed in needle, then inserted full length of needle and withdrawn per handbook instructions.  There was minimal blood loss.  Patient insertion site covered with guaze and a pressure bandage to reduce any bruising.  The patient tolerated the procedure well and was given post procedure instructions. Return in about one month for Nexplanon check.  Advised to see PCP for evaluation of carpal tunnel syndrome and possible referral to PT or Sports Medicine.   Laury Deep, CNM  08/23/2018 10:07 AM

## 2018-09-18 ENCOUNTER — Ambulatory Visit: Payer: 59 | Admitting: Obstetrics and Gynecology

## 2019-04-17 ENCOUNTER — Other Ambulatory Visit: Payer: Self-pay

## 2019-04-17 ENCOUNTER — Encounter: Payer: Self-pay | Admitting: Obstetrics and Gynecology

## 2019-04-17 ENCOUNTER — Ambulatory Visit (INDEPENDENT_AMBULATORY_CARE_PROVIDER_SITE_OTHER): Payer: 59 | Admitting: Obstetrics and Gynecology

## 2019-04-17 ENCOUNTER — Encounter: Payer: Self-pay | Admitting: General Practice

## 2019-04-17 VITALS — BP 122/82 | HR 98 | Temp 98.4°F | Wt 208.0 lb

## 2019-04-17 DIAGNOSIS — Z308 Encounter for other contraceptive management: Secondary | ICD-10-CM

## 2019-04-17 DIAGNOSIS — Z3046 Encounter for surveillance of implantable subdermal contraceptive: Secondary | ICD-10-CM | POA: Diagnosis not present

## 2019-04-17 MED ORDER — NORGESTIMATE-ETH ESTRADIOL 0.25-35 MG-MCG PO TABS: 1.0000 | ORAL_TABLET | Freq: Every day | ORAL | 4 refills | Status: AC

## 2019-04-17 NOTE — Patient Instructions (Signed)
Birth Control Pill Instructions   When to start?  Begin your first pack of pills on the first day of your period for maximum effectiveness. If that is not possible, begin before the 5th day. Start by taking the pill in the upper left corner (top row) of your pack. Keep taking the pills and begin your next pack after finishing all 28 pills. Your pill pack contains pills in 2 or more colors. Estrogen and progestin are in at least 21 of the 28 pills ("active" pills).   When am I protected from pregnancy?  If you begin your pills on the 1st day of your period, you are protected immediately. If you begin your pills in the first 5 days of your period, you are protected after 7 days of continuous use. If you begin your pills later than the 5th day of your period, you will not be protected that month.   What if I don't have regular periods and it may not start for a couple of months? You can start your pill on any day, but use a back-up method for the first 30 days.   What time of day should I take my pills?  The time of day is not important as taking them at the same time every day. It is especially important to maintain a consistent schedule (<4hr variation) if you take low estrogen pills (24mcg or less).   If I have spotting, should I stop taking the pill?  No, spotting is a common side effect, especially the first 1-3 months of taking the pill. Spotting is common when you take them continuously, meaning, you don't take the placebo pills (different colored pills at the end of the monthly pack). Some brands are designed to be taken continuously for 84-87 days to decrease the number of periods that occur in a year. The pills you were prescribed can be taken in this manner.  Will I gain weight or will my breasts enlarge?  This is a common concern. Research blames about 5 pounds of weight gain on the hormonal changes that cause fluid retention. The estrogen in birth control pills causes breast enlargement in  many women. Breast tenderness may occur for a couple of months. If the pill is discontinued, these changes are reversed.   What are the most common side effects?  At the beginning, many women have breast tenderness, nausea and spotting. These symptoms usually subside within a month or two. Women who wear contact lenses may notice their eyes being dry. This can interfere with the ability to wear contacts. Periods often become lighter while taking the pill.   Are there more serious risks to taking the pill?  Yes, medications that contain estrogen increase your risk of high blood pressure and for blood clots to form in the lower legs which can cause a stroke, pulmonary embolus or heart attack. The pill increases cholesterol levels and can increase the risk of gallbladder or liver disease. All risks increase with age.   Should anyone NOT take the pill?  Yes, women who have had breast or uterine cancer, blood clots or strokes, have heart valve disorders, liver disease, a condition that increases the risk of blood clots, or are over the age of 60 and smoke cigarettes should not take it. The pill may cause or worsen migraine headaches and should not be taken in those cases. The pill should not be taken if you are breast-feeding or pregnant, or within 4 weeks of pregnancy.  What should I do if I forget to take a pill? Or more than one?  If you forget one pill, take it as soon as your remember. Take the next one at its usual time. If you take two pills together, you should expect nausea-this is what morning sickness is like. If you forget to take two pills in a row and you are in week 1 or 2 of your pill pack, take two pills that (same) day and two pills the next day. If you are in week 3 then throw out that pack and begin a new pack that day. If you have forgotten more than 2 pills, throw out that pack and begin a new pack that day.   You may experience a skipped period and/or spotting through the next pack.  Continue taking your pills. Any time an active pill is forgotten, a back-up method should be used until the next 7 pills have been taken at the routine time. If you forget a placebo pill, throw away any missed pills and keep taking a pill a day until you finish your pack. Placebo pills don't contain hormones- they are "inert"-but make sure you don't go more than 7 days before taking an active pill. You won't need a back up method if you miss a placebo pill.   Is the pill foolproof?  Absolutely not. It is 99% effective if taken perfectly-meaning every day at the same hour of the day. Still some women become pregnant. With average use, the effectiveness is 92%.   Antibiotics, especially amoxicillin, ampicillin, penicillin, minocycline, doxycycline, tetracycline, clarithromycin, griseofulvin, predisone, theophylline, and rifampin can decrease their effectiveness.   In addition, women who take dilantin, phenobarbital, barbiturates, HIV medications, Tylenol, vitamin C and St. John's Wort should be very cautious, and perhaps use a back-up method, as these medications can decrease the effectiveness of the pill.   Can the pill make my acne better?  It's quite possible. Acne involves an interaction between your hormones and the bacteria on your skin. If your hormones are better regulated with the pill then your acne may improve. However, the progestin may not agree with your system and cause acne or worsen it. One particular brand has FDA approval to advertise that its pill helps acne because that company included that factor in the research before the pill was available to the public.   How does it work?  These hormones suppress ovulation and cause endometrial (the lining of the uterus) and cervical mucus changes. The levels of follicle-stimulating hormone (FSH) and luteinizing hormone (LH) are decreased. [place] stimulates the growth and maturation of ovarian follicles and LH triggers ovulation. Therefore, no eggs  are produced unless the hormone levels drop by forgetting a pill, stopping them entirely or taking medications that increase the speed at which the pill is metabolized.   Remember the pill does not prevent sexually transmitted infections. If you develop any unexpected symptoms, contact your provider, or go to the emergency room if severe symptoms occur.

## 2019-04-17 NOTE — Progress Notes (Signed)
[name] is a [id number] female in the office today for removal of Nexplanon; which was inserted 08/23/2018 at [facility]. She desires to have it removed today. She has complaints of bleeding all the month of December, then not again until May. She reports she bled the entire month of May. She states that she has talked to many people who have suggested that her Nexplanon is causing her to be unable to lose weight and decreased libido.  BP 122/82 (BP Location: Right Arm, Patient Position: Sitting, Cuff Size: Large)   Pulse 98   Temp 98.4 F (36.9 C) (Oral)   Wt 208 lb (94.3 kg)   BMI 33.57 kg/m    Procedure Note: Consent obtained and Time-Out conducted Implant palpated in left upper arm Betadine prep done on area of excision/removal Lidocaine infiltrated into intradermal and subcutaneous space Small 64mm incision made with scalpel Pressure applied to distal end of implant which exposed the tip through incision Tip of implant grasped with hemostat There was some adherence of implant in subcutaneous tissue.  Twisting and manipulation freed the implant from the capsule Implant removed Pressure held on incision until bleeding stopped Steristrips applied to incision Pressure dressing applied by RN Patient tolerated procedure well.   Assessment and Plan: Encounter for surveillance of implantable subdermal contraceptive  - Nexplanon removed as documented above  Encounter for other contraceptive management  - Rx norgestimate-ethinyl estradiol (ORTHO-CYCLEN) 0.25-35 MG-MCG tablet -- take one active pill daily for 11 wks, then take placebo pills for 12th week; to achieve extended period    Time spent with patient doing procedure 10 minutes.  Laury Deep, CNM  04/17/2019 3:04 PM

## 2020-02-03 IMAGING — US US MFM OB FOLLOW-UP
1 series · 14 of 28 positions shown · non-contrast
Comparison: none

[Series 1: us mfm ob follow-up · 75 acquisitions, 14 frames shown]
[im 3/75]
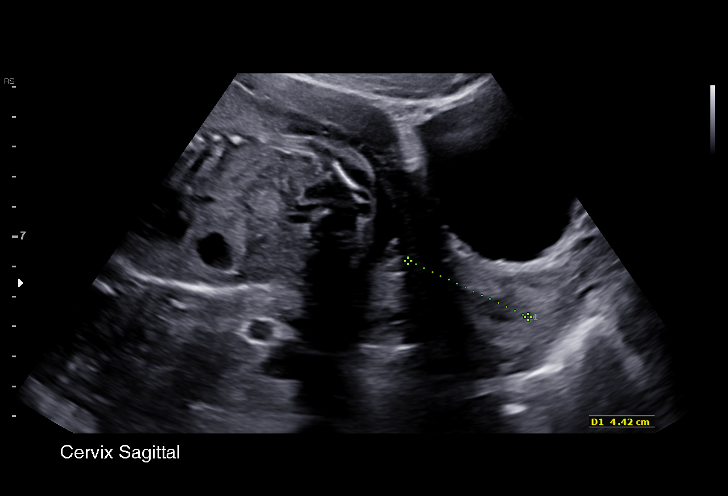
[im 9/75]
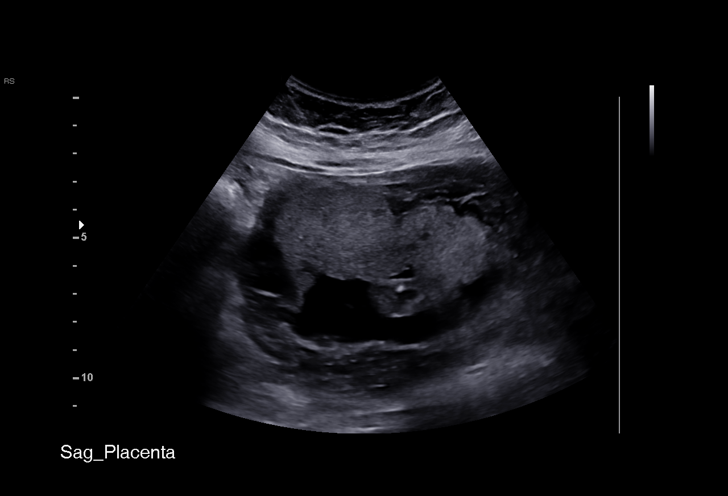
[im 14/75]
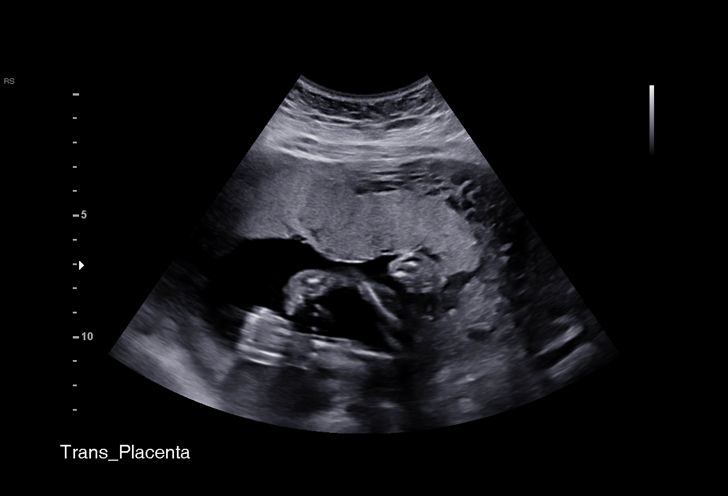
[im 20/75]
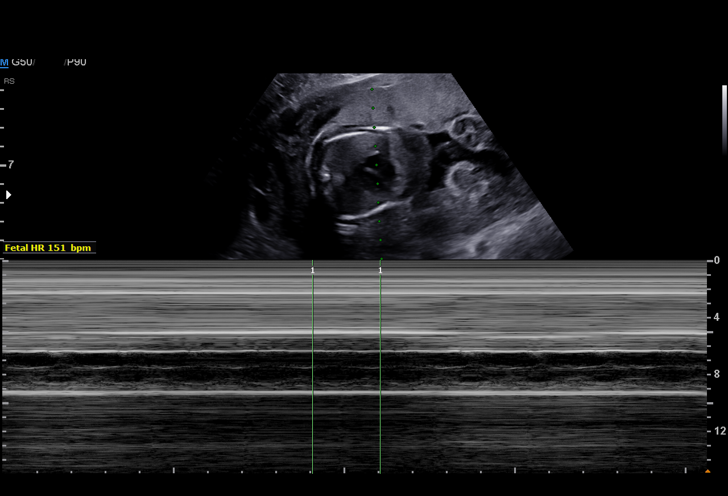
[im 25/75]
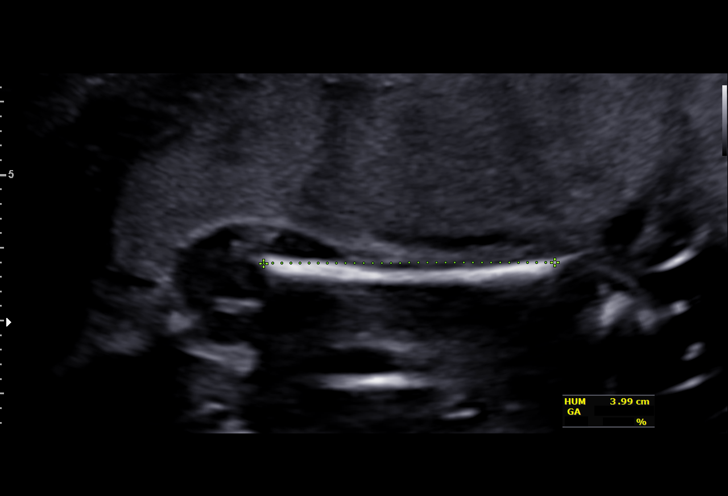
[im 31/75]
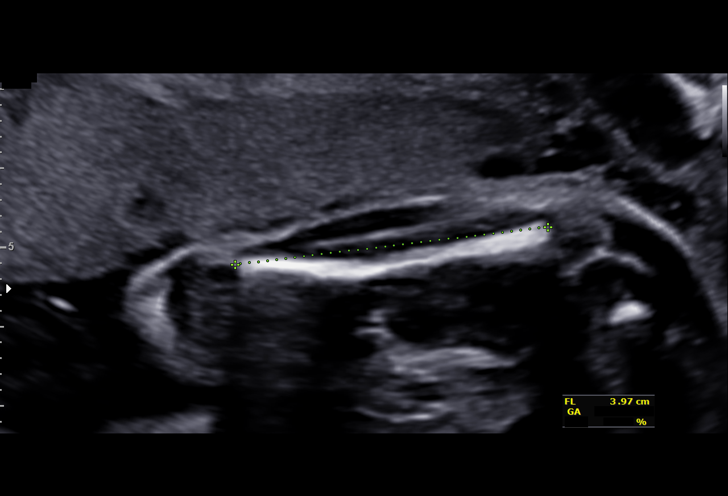
[im 36/75]
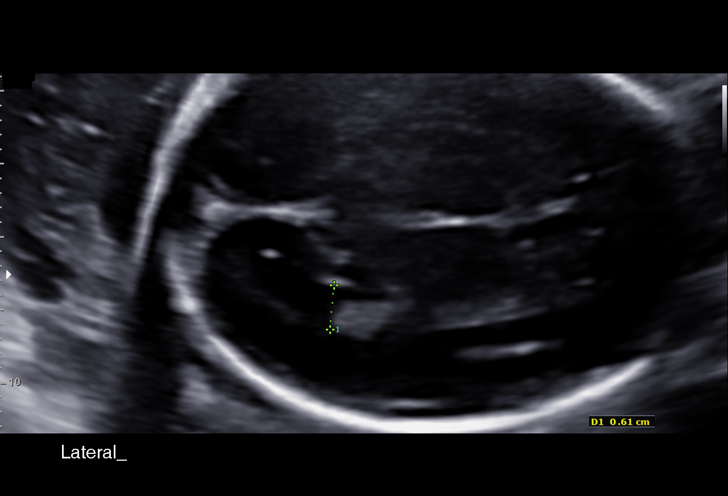
[im 42/75]
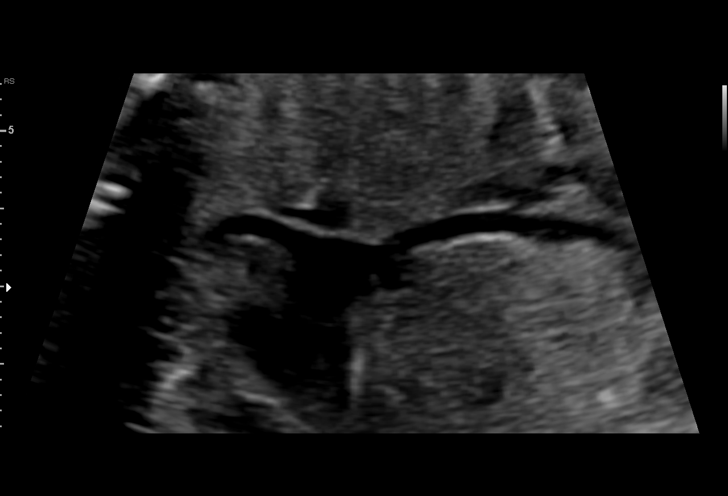
[im 47/75]
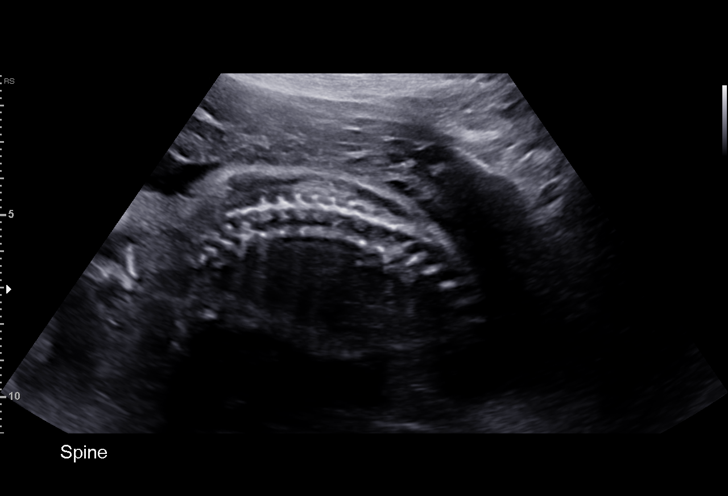
[im 53/75]
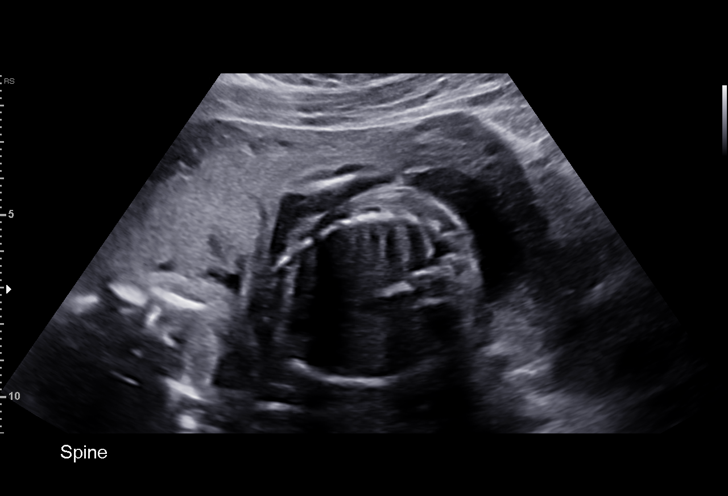
[im 58/75]
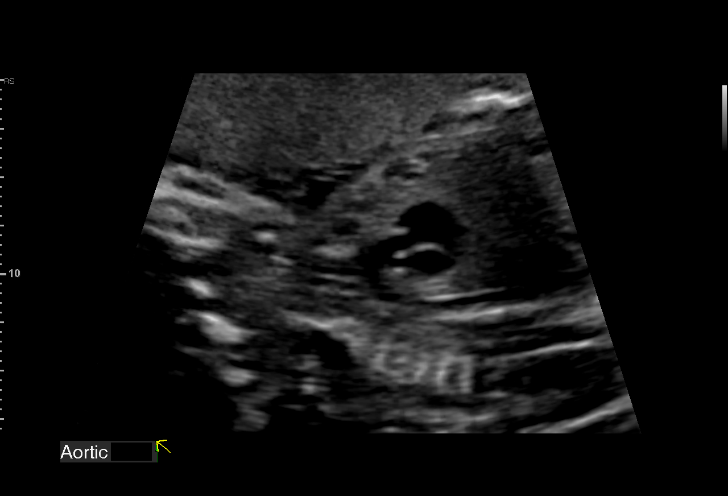
[im 64/75]
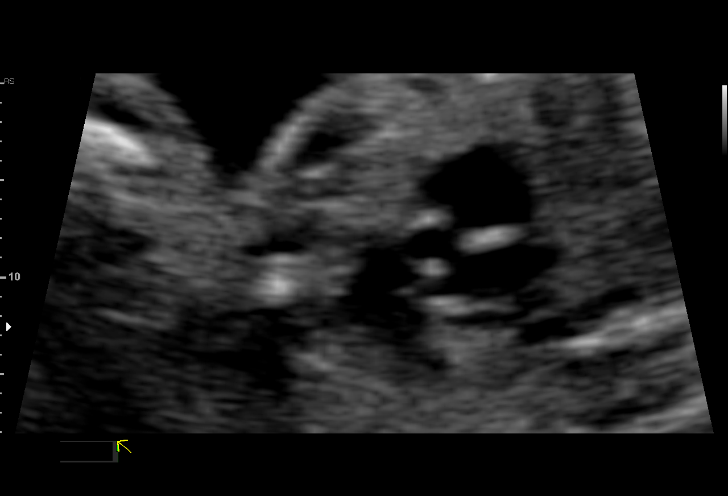
[im 69/75]
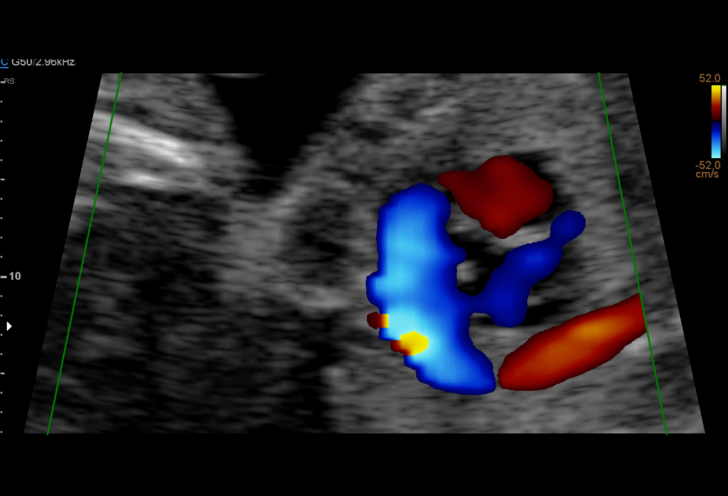
[im 75/75]
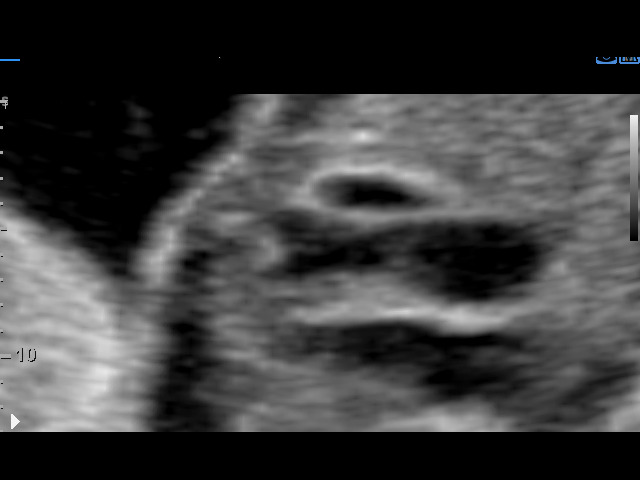

[14 of 28 positions shown; findings below may reference images not displayed]

1  HARALD BOGGAN            [id number]      [id number]     [id number]
Indications

23 weeks gestation of pregnancy
Encounter for other antenatal screening
follow-up
Antenatal follow-up for nonvisualized fetal
anatomy
OB History

Blood Type:            Height:  5'6"   Weight (lb):  205       BMI:
Gravidity:    1         Term:   0        Prem:   0        SAB:   0
TOP:          0       Ectopic:  0        Living: 0
Fetal Evaluation

Num Of Fetuses:     1
Fetal Heart         151
Rate(bpm):
Cardiac Activity:   Observed
Presentation:       Breech
Placenta:           Anterior, above cervical os
P. Cord Insertion:  Previously Visualized

Amniotic Fluid
AFI FV:      Subjectively within normal limits

Largest Pocket(cm)
4.87
Biometry

BPD:      56.5  mm     G. Age:  23w 2d         38  %    CI:        72.71   %    70 - 86
FL/HC:      18.9   %    19.2 -
HC:      210.7  mm     G. Age:  23w 1d         24  %    HC/AC:      1.10        1.05 -
AC:      191.9  mm     G. Age:  23w 6d         57  %    FL/BPD:     70.6   %    71 - 87
FL:       39.9  mm     G. Age:  22w 6d         22  %    FL/AC:      20.8   %    20 - 24
HUM:      39.7  mm     G. Age:  24w 1d         59  %

Est. FW:     593  gm      1 lb 5 oz     53  %
Gestational Age

LMP:           23w 3d        Date:  09/29/17                 EDD:   07/06/18
U/S Today:     23w 2d                                        EDD:   07/07/18
Best:          23w 3d     Det. By:  LMP  (09/29/17)          EDD:   07/06/18
Anatomy

Cranium:               Appears normal         Aortic Arch:            Appears normal
Cavum:                 Previously seen        Ductal Arch:            Previously seen
Ventricles:            Appears normal         Diaphragm:              Previously seen
Choroid Plexus:        Previously seen        Stomach:                Appears normal, left
sided
Cerebellum:            Previously seen        Abdomen:                Appears normal
Posterior Fossa:       Previously seen        Abdominal Wall:         Previously seen
Nuchal Fold:           Not applicable (>20    Cord Vessels:           Previously seen
wks GA)
Face:                  Orbits and profile     Kidneys:                Appear normal
previously seen
Lips:                  Previously seen        Bladder:                Appears normal
Thoracic:              Appears normal         Spine:                  Not well visualized
Heart:                 Not well visualized    Upper Extremities:      Previously seen
RVOT:                  Appears normal         Lower Extremities:      Previously seen
LVOT:                  Appears normal

Other:  Fetus appears to be a male. Heels previously visualized. Technically
difficult due to maternal habitus and fetal position.
Cervix Uterus Adnexa

Cervix
Length:           4.42  cm.
Normal appearance by transabdominal scan.

Uterus
No abnormality visualized.

Left Ovary
Not visualized.

Right Ovary
Within normal limits.

Adnexa:       No abnormality visualized. No adnexal mass
visualized.
Comments

The fetal anatomy remains incomplietely visualized after
ultrasounds. It is unlikely that further attempts will allow
complete visualization of the fetal anatomy. However, if
completion is desired an additional follow up ultrasound
should be performed in 4 weeks.
Impression

Single living intrauterine pregnancy at 23 weeks 3 days.
Appropriate fetal growth (53%).
Normal amniotic fluid volume.
The fetal anatomic survey is not complete.
No gross fetal anomalies identified.
The left ovary could not be visualized on today's exam.
The right ovary is seen an aoppears normal.
Recommendations

Follow-up ultrasounds as clinically indicated (see comments).
